# Patient Record
Sex: Female | Born: 1970 | Race: White | Hispanic: No | Marital: Married | Smoking: Never smoker
Health system: Southern US, Community
[De-identification: ages and names within clinical notes are randomized; demographics above are authoritative.]

## PROBLEM LIST (undated history)

## (undated) DIAGNOSIS — K529 Noninfective gastroenteritis and colitis, unspecified: Secondary | ICD-10-CM

## (undated) DIAGNOSIS — E039 Hypothyroidism, unspecified: Secondary | ICD-10-CM

## (undated) DIAGNOSIS — N329 Bladder disorder, unspecified: Secondary | ICD-10-CM

## (undated) DIAGNOSIS — F419 Anxiety disorder, unspecified: Secondary | ICD-10-CM

## (undated) DIAGNOSIS — D649 Anemia, unspecified: Secondary | ICD-10-CM

## (undated) HISTORY — DX: Anemia, unspecified: D64.9

## (undated) HISTORY — DX: Noninfective gastroenteritis and colitis, unspecified: K52.9

## (undated) HISTORY — DX: Anxiety disorder, unspecified: F41.9

## (undated) HISTORY — DX: Hypothyroidism, unspecified: E03.9

## (undated) HISTORY — PX: WISDOM TOOTH EXTRACTION: SHX21

## (undated) HISTORY — PX: MOUTH SURGERY: SHX715

## (undated) HISTORY — PX: DENTAL SURGERY: SHX609

---

## 1999-03-04 ENCOUNTER — Other Ambulatory Visit: Admission: RE | Admit: 1999-03-04 | Discharge: 1999-03-04 | Payer: Self-pay | Admitting: Family Medicine

## 2000-02-15 ENCOUNTER — Inpatient Hospital Stay (HOSPITAL_COMMUNITY): Admission: AD | Admit: 2000-02-15 | Discharge: 2000-02-15 | Payer: Self-pay | Admitting: *Deleted

## 2000-02-15 ENCOUNTER — Encounter: Payer: Self-pay | Admitting: *Deleted

## 2000-03-17 ENCOUNTER — Inpatient Hospital Stay (HOSPITAL_COMMUNITY): Admission: AD | Admit: 2000-03-17 | Discharge: 2000-03-17 | Payer: Self-pay | Admitting: *Deleted

## 2000-04-04 ENCOUNTER — Inpatient Hospital Stay (HOSPITAL_COMMUNITY): Admission: AD | Admit: 2000-04-04 | Discharge: 2000-04-04 | Payer: Self-pay | Admitting: Obstetrics & Gynecology

## 2000-04-04 ENCOUNTER — Encounter: Payer: Self-pay | Admitting: Obstetrics & Gynecology

## 2000-05-08 ENCOUNTER — Inpatient Hospital Stay (HOSPITAL_COMMUNITY): Admission: AD | Admit: 2000-05-08 | Discharge: 2000-05-11 | Payer: Self-pay | Admitting: Obstetrics and Gynecology

## 2001-07-25 ENCOUNTER — Other Ambulatory Visit: Admission: RE | Admit: 2001-07-25 | Discharge: 2001-07-25 | Payer: Self-pay | Admitting: Obstetrics & Gynecology

## 2002-03-03 ENCOUNTER — Other Ambulatory Visit: Admission: RE | Admit: 2002-03-03 | Discharge: 2002-03-03 | Payer: Self-pay | Admitting: Obstetrics & Gynecology

## 2002-08-22 ENCOUNTER — Encounter: Payer: Self-pay | Admitting: Obstetrics & Gynecology

## 2002-08-22 ENCOUNTER — Inpatient Hospital Stay (HOSPITAL_COMMUNITY): Admission: AD | Admit: 2002-08-22 | Discharge: 2002-08-22 | Payer: Self-pay | Admitting: Obstetrics & Gynecology

## 2002-10-01 ENCOUNTER — Inpatient Hospital Stay (HOSPITAL_COMMUNITY): Admission: AD | Admit: 2002-10-01 | Discharge: 2002-10-01 | Payer: Self-pay | Admitting: Obstetrics & Gynecology

## 2002-10-01 ENCOUNTER — Encounter: Payer: Self-pay | Admitting: Obstetrics & Gynecology

## 2002-10-03 ENCOUNTER — Inpatient Hospital Stay (HOSPITAL_COMMUNITY): Admission: AD | Admit: 2002-10-03 | Discharge: 2002-10-05 | Payer: Self-pay | Admitting: Obstetrics and Gynecology

## 2002-11-15 ENCOUNTER — Other Ambulatory Visit: Admission: RE | Admit: 2002-11-15 | Discharge: 2002-11-15 | Payer: Self-pay | Admitting: Obstetrics & Gynecology

## 2005-01-19 ENCOUNTER — Encounter: Admission: RE | Admit: 2005-01-19 | Discharge: 2005-01-19 | Payer: Self-pay | Admitting: Family Medicine

## 2006-10-12 ENCOUNTER — Ambulatory Visit: Payer: Self-pay | Admitting: Gastroenterology

## 2006-10-13 ENCOUNTER — Ambulatory Visit (HOSPITAL_COMMUNITY): Admission: RE | Admit: 2006-10-13 | Discharge: 2006-10-13 | Payer: Self-pay | Admitting: Gastroenterology

## 2007-07-07 DIAGNOSIS — R109 Unspecified abdominal pain: Secondary | ICD-10-CM | POA: Insufficient documentation

## 2008-08-28 ENCOUNTER — Emergency Department (HOSPITAL_BASED_OUTPATIENT_CLINIC_OR_DEPARTMENT_OTHER): Admission: EM | Admit: 2008-08-28 | Discharge: 2008-08-28 | Payer: Self-pay | Admitting: Emergency Medicine

## 2009-05-31 ENCOUNTER — Inpatient Hospital Stay (HOSPITAL_COMMUNITY): Admission: AD | Admit: 2009-05-31 | Discharge: 2009-05-31 | Payer: Self-pay | Admitting: Obstetrics & Gynecology

## 2009-06-24 ENCOUNTER — Inpatient Hospital Stay (HOSPITAL_COMMUNITY): Admission: AD | Admit: 2009-06-24 | Discharge: 2009-06-24 | Payer: Self-pay | Admitting: Obstetrics and Gynecology

## 2009-07-30 ENCOUNTER — Observation Stay (HOSPITAL_COMMUNITY): Admission: RE | Admit: 2009-07-30 | Discharge: 2009-07-30 | Payer: Self-pay | Admitting: Obstetrics & Gynecology

## 2009-08-02 ENCOUNTER — Inpatient Hospital Stay (HOSPITAL_COMMUNITY): Admission: AD | Admit: 2009-08-02 | Discharge: 2009-08-04 | Payer: Self-pay | Admitting: Obstetrics & Gynecology

## 2010-06-09 LAB — CBC
HCT: 27.8 % — ABNORMAL LOW (ref 36.0–46.0)
Hemoglobin: 9.4 g/dL — ABNORMAL LOW (ref 12.0–15.0)
RDW: 15.9 % — ABNORMAL HIGH (ref 11.5–15.5)

## 2010-06-10 LAB — CBC
HCT: 27.9 % — ABNORMAL LOW (ref 36.0–46.0)
Hemoglobin: 9.4 g/dL — ABNORMAL LOW (ref 12.0–15.0)
MCHC: 33.9 g/dL (ref 30.0–36.0)
RDW: 15.4 % (ref 11.5–15.5)

## 2010-06-11 LAB — CBC
HCT: 29.5 % — ABNORMAL LOW (ref 36.0–46.0)
Hemoglobin: 10 g/dL — ABNORMAL LOW (ref 12.0–15.0)
MCV: 88.8 fL (ref 78.0–100.0)
RBC: 3.32 MIL/uL — ABNORMAL LOW (ref 3.87–5.11)
WBC: 14.7 10*3/uL — ABNORMAL HIGH (ref 4.0–10.5)

## 2010-06-11 LAB — DIFFERENTIAL
Eosinophils Absolute: 0 10*3/uL (ref 0.0–0.7)
Eosinophils Relative: 0 % (ref 0–5)
Lymphs Abs: 1 10*3/uL (ref 0.7–4.0)
Monocytes Relative: 5 % (ref 3–12)

## 2010-06-11 LAB — URINALYSIS, ROUTINE W REFLEX MICROSCOPIC
Glucose, UA: NEGATIVE mg/dL
Ketones, ur: NEGATIVE mg/dL
Protein, ur: NEGATIVE mg/dL

## 2010-06-13 LAB — URINALYSIS, ROUTINE W REFLEX MICROSCOPIC
Glucose, UA: NEGATIVE mg/dL
Ketones, ur: NEGATIVE mg/dL
Protein, ur: NEGATIVE mg/dL

## 2010-06-13 LAB — URINE MICROSCOPIC-ADD ON

## 2010-06-30 LAB — RAPID STREP SCREEN (MED CTR MEBANE ONLY): Streptococcus, Group A Screen (Direct): NEGATIVE

## 2010-08-05 NOTE — Assessment & Plan Note (Signed)
HEALTHCARE                         GASTROENTEROLOGY OFFICE NOTE   ZURISADAI, HELMINIAK                         MRN:          [id number]  DATE:10/12/2006                            DOB:          1970/08/02    MAIN COMPLAINT:  Gina Bauer is a 40 year old white female referred for  evaluation of crampy intermittent lower abdominal pain.   HISTORY OF PRESENT ILLNESS:  Gina Bauer has had recurrent suprapubic  abdominal pain, which she describes as a dull aching sensation, which  may last a few hours to 24 hours in duration for at least 2 years.  She  had some of these episodes with her pregnancy with her last child, in  retrospect, but has had more often and frequent episodes of lower  abdominal dull aching pain over the last year occurring approximately  twice a month.  She has no precipitating events, except for stress.  There is no real alleviating element, and she has fairly regular bowel  movements without diarrhea, constipation, melena, or hematochezia.  Her  appetite is good and her weight is stable.  She denies upper GI or  hepatobiliary complaints.  She has not had any abdominal x-rays or  endoscopic exams.  She denies any specific food intolerances.  She  denies systemic complaints such as fever, chills, skin rashes, joint  pains, oral stomatitis, et Karie Soda.  She is due for followup gynecologic  exam in September, but denies any chronic recurrent gynecologic  problems.   PAST MEDICAL HISTORY:  Otherwise, noncontributory.   MEDICATIONS:  Vesicare 5 mg daily for urinary spasm.   She sees Dr. Isabel Caprice.   She denies drug allergies.   FAMILY HISTORY:  Remarkable for breast cancer in her mother, diabetes in  her mother, and prostate cancer in her father with several members with  atherosclerotic cardiovascular disease.  There is no history of known  gastrointestinal problems.   SOCIAL HISTORY:  She is married and lives with her spouse, kids,  and  parents.  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  She  does not smoke or abuse ethanol.   REVIEW OF SYSTEMS:  Entirely noncontributory, except for urinary  frequency.  Her last menstrual period was July 10 of this year.  She  denies current cardiovascular, pulmonary, neurological, orthopedic,  endocrine, dermatologic, or neuropsychiatric problems.   PHYSICAL EXAM:  She is an attractive and healthy-appearing female in no  distress, appearing her stated age.  She is 5 feet 1 inches tall and weighs 111 pounds.  Blood pressure is  108/56, and pulse was 74 and regular.  I could not appreciate stigmata of chronic liver disease or thyromegaly.  Her chest was entirely clear, but she did have a soft systolic ejection  murmur at the left lower sternal border with occasional ectopic beat.  I could not appreciate definite hepatosplenomegaly, abdominal masses, or  tenderness.  Bowel sounds were normal.  Peripheral extremities were unremarkable.  Rectal exam is refused by the patient.  Mental status is clear.   ASSESSMENT:  Gina Bauer has very unusual lower abdominal-suprapubic pain  without other gastrointestinal relationships.  I suspect she has a  variation of irritable bowel syndrome, but I do think she needs pelvic  ultrasound exam to exclude endometriosis or any other gynecologic  problems.  She is not scheduled with GYN until several months from now.   RECOMMENDATIONS:  1. Obtain recent laboratory data from Dr. Smith Mince.  2. Stool Hemoccult cards.  3. Pelvic ultrasound exam.  4. P.r.n. Bentyl every 6 to 8 hours as needed for abdominal cramps in      the interim.  5. GI followup in several weeks' time and consider further workup,      including colonoscopy depending on her workup and clinical course.     Vania Rea. Jarold Motto, MD, Caleen Essex, FAGA  Electronically Signed    DRP/MedQ  DD: 10/12/2006  DT: 10/12/2006  Job #: [id number]   cc:   Talmadge Coventry,  M.D.  Dr. Ruby Cola, M.D.

## 2010-08-08 NOTE — H&P (Signed)
NAME:  Gina Bauer, Gina Bauer                            ACCOUNT NO.:  [id number]   MEDICAL RECORD NO.:  [id number]                   PATIENT TYPE:  INP   LOCATION:  9160                                 FACILITY:  [facility]   PHYSICIAN:  Genia Del, M.D.             DATE OF BIRTH:  12-01-1970   DATE OF ADMISSION:  10/03/2002  DATE OF DISCHARGE:                                HISTORY & PHYSICAL   CHIEF COMPLAINT:  The patient is a 40 year old G-3, P-1, A-1, 0, 1.  Expected date of delivery October 11, 2002, at 38 weeks and six days gestation.   REASON FOR ADMISSION:  Induction for macrosomia and recurrent right  abdominal pain.   HISTORY OF PRESENT ILLNESS:  Fetal movements positive.  No regular uterine  contractions.  No PIH symptoms.  The patient was kept under observation at  [facility] on October 01, 2002, for recurrent right abdominal pain.  At that time an  abdominal ultrasound was negative.  A renal ultrasound showed moderate right  hydronephrosis, but no stone.  Urinalysis was within normal limits.  We did  an obstetrical ultrasound which showed an estimated fetal weight above the  95th percentile at 9 pounds and 3 ounces, to 9 pounds and 10 ounces.  Amniotic fluid was normal.  The presentation was cephalic.   PAST OBSTETRICAL HISTORY:  1. G-1 in February 2002, 41 weeks, a spontaneous vaginal delivery.  Baby boy     weighing 8 pounds and 12 ounces.  No complications.  2. In April 2003, five to six-week spontaneous abortion complete.  No     complications.  3. In G-1 the patient had mild thrombocytopenia of pregnancy, and anemia.   PAST SURGICAL HISTORY:  Gum surgery in 1999, and in 2000.   PAST MEDICAL HISTORY:  Negative.   ALLERGIES:  No known drug allergies.   MEDICATIONS:  Prenatal vitamins and iron supplements.   SOCIAL HISTORY:  Married, a nonsmoker.   FAMILY HISTORY:  Noncontributory.   HISTORY OF PRESENT PREGNANCY:  First trimester was normal.  Labs showed a  hemoglobin at  11.8, platelets 204.  Blood group A-positive.  Rh antibodies  negative.  RPR nonreactive.  HBsAG negative.  Rubella titer immune.  HIV  declined.  Gonorrhea and Chlamydia negative.  Pap test within normal limits.  An ultrasound done at 8+ weeks revealed a single intrauterine pregnancy.  Fetal heart rate 176.  Dating corresponded.  In the second trimester the patient declined the triple test.  An ultrasound  review of her anatomy was done at 21+ weeks.  It was within normal limits.  The placenta anterior and normal.  Amniotic fluid normal.  Pelvic closed at  4.3 cm.  Dating corresponded.  In the third trimester a one-hour glucose tolerance test was within normal  limits.  Group-B strep was negative at 35+ weeks.  Blood pressures remained  normal throughout the pregnancy.  Uterine height corresponded, although the  last ultrasound showed macrosomia.   REVIEW OF SYSTEMS:  CONSTITUTIONAL:  Negative.  HEENT:  Negative.  CARDIOVASCULAR:  Negative.  RESPIRATORY:  Negative.  UROLOGIC/GI:  Negative,  but recurrent right abdominal pain with a negative workup.  GENITOURINARY/ENDOCRINE/NEUROLOGIC:  Negative.   PHYSICAL EXAMINATION:  GENERAL:  In no apparent distress.  VITAL SIGNS:  Blood pressure 114/66, pulse 82 and regular, respirations 20,  temperature 97.2 degrees.  LUNGS:  Clear bilaterally.  HEART:  A regular cardiac rhythm.  ABDOMEN:  Gravid uterus, cephalic presentation.  PELVIC:  The vaginal examination on admission 3 cm dilated, 70% effaced,  vertex -1.  Membranes intact.  EXTREMITIES:  Lower limbs normal.  MONITORING:  Rare uterine contractions.  Fetal heart rate 140 with  accelerations, no decelerations.  Reactive NST.   IMPRESSION:  1. G-3, P-1, A-1 at 38 weeks and six days gestation with a high suspicion of     macrosomia and recurrent right abdominal pain.  Fetal well being     reassuring.  Favorable cervix.  2. Group beta Streptococcus negative.   PLAN:  Admit to labor and  delivery.  Induction with low-dose Pitocin and an  artificial rupture of membranes.  Monitoring.  Expectant management towards  probably vaginal delivery.                                                Genia Del, M.D.    ML/MEDQ  D:  10/03/2002  T:  10/03/2002  Job:  [id number]

## 2010-08-08 NOTE — Consult Note (Signed)
[facility]  Patient:    Gina Bauer, Gina Bauer                         MRN: [id number] Adm. Date:  [id number] Attending:  Ermalene Searing CC:         Marina Gravel, M.D.   Consultation Report  GENERAL SURGICAL CONSULTATION  REASON FOR CONSULTATION:  Right lower quadrant abdominal pain, rule out appendicitis.  HISTORY OF PRESENT ILLNESS:  This is a 40 year old white female who is [redacted] weeks pregnant with her first pregnancy.  She had been doing well.  Yesterday morning she developed a sense of malaise and anorexia.  She spent most of yesterday driving back from [place] with her husband.  Yesterday she developed a watery diarrhea and in the evening hours she had at least three episodes of diarrhea.  She has not had a bowel movement since 8 p.m. last night.  She developed crampy, lower abdominal pain yesterday afternoon and about midnight last night this pain became more steady in character.  She vomited once after coming to the [facility], but has not vomited since.  She denies back pain, denies urinary frequency or dysuria.  She denies fever, chills or prior episodes.  She was initially evaluated by Dr. Marina Gravel who felt she was having a lot of pain, and specifically thought she had localized tenderness in the right lower quadrant.  The patient now states she is much better and is beginning to be hungry.  PAST HISTORY:  No gynecologic problems.  She had a workup for hematuria in the past which was negative.  CURRENT MEDICATIONS:  Prenatal vitamins.  ALLERGIES:  No known drug allergies.  FAMILY HISTORY:  History of stroke, asthma, DVT and rheumatic fever in the family.  REVIEW OF SYSTEMS:  All systems are reviewed and are noncontributory.  PHYSICAL EXAMINATION:  GENERAL:  A pleasant young woman who appears to be in minimal if any distress. She is alert and cooperative.  VITAL SIGNS:  Temperature is 98.9, heart rate  111, fetal heart tones in the 150s and apparently normal.  Respiratory rate 18, blood pressure 128/79.  HEENT:  Sclerae clear, extraocular movements intact.  NECK:  No adenopathy or mass.  LUNGS:  Clear to auscultation, no wheezing.  HEART:  Regular tachycardia, no murmur.  ABDOMEN:  Gravid.  Soft.  Hypoactive bowel sounds.  There is minimal diffuse tenderness, no localized tenderness, no guarding, rebound or hernia.  LABORATORY DATA:  White blood cell count 16,500 last night.  Repeat this morning 12,600 but there is a left shift.  Basic metabolic panel shows a potassium of 3.3 and is otherwise unremarkable.  CT scan shows right hydronephrosis which is thought to be physiologic for pregnancy.  There is no evidence for appendicitis or inflammatory change in the small bowel or large bowel.  There is no fluid collection or abscess, no bowel wall thickening.  IMPRESSION:  Abdominal pain. Most likely viral gastroenteritis.  This may be related to her right hydronephrosis, in some way, but I doubt this.  I strongly doubt she has appendicitis.  PLAN:  The patients care was discussed with Dr. Marina Gravel, the patient and her husband.  We are going to allow her to eat while she is here and if she tolerates the diet we will let her go home with close observational follow-up by her obstetrician.  She may follow-up with me p.r.n. DD:  02/15/00 TD:  02/15/00 Job: [id number]

## 2010-08-08 NOTE — H&P (Signed)
[facility]  Patient:    Gina Bauer, Gina Bauer                      MRN: [id number] Adm. Date:  05/08/00 Attending:  Genia Del, M.D.                         History and Physical  DATE OF BIRTH:                Jul 04, 1970  REASON FOR ADMISSION:         Induction for postdates.  HISTORY OF PRESENT ILLNESS:   Gina Bauer is a 40 year old woman, G1, expected date of delivery per ultrasound of May 01, 2000, at [redacted] weeks gestation. Fetal movements positive.  Rare uterine contractions.  No vaginal bleeding. No fluid leak.  No PIH symptoms.  She was last seen at the office on May 05, 2000.  NST was done at that time which was reactive.  She had an ultrasound on May 03, 2000, showing an estimated fetal weight of 3789 g for the 60% percentile.  Amniotic fluid index was slightly decreased at 7.6 cm for the 9th percentile.  Placenta was grade 3.  Biophysical profile was 8/8.  Presentation was solid.  PAST MEDICAL HISTORY:         1. Hematuria, with a negative workup.                               2. History of mild anemia.  PAST SURGICAL HISTORY:        Negative.  FAMILY HISTORY:               Positive for asthma, insulin-dependent diabetes mellitus, cardiac disease, and breast cancer in mother.  Prostate cancer in father.  ALLERGIES:                    No known drug allergies.  SOCIAL HISTORY:               Married.  Nonsmoker.  MEDICATIONS:                  Prenatal vitamins.  HISTORY OF PRESENT PREGNANCY:                    First trimester was unremarkable.  Laboratories in the first trimester showed hemoglobin 12.5, platelets were low at 97,000. Blood group A positive, Rh negative, RPR nonreactive.   HBsAg negative.  HIV negative.  Rubella immune.  Given the low platelets, they were repeated at 14+ weeks, and they were the same at 97,000.  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  The platelets then improved  and came back to normal levels.  The last platelet count was 251,000.  Triple test at 16+ weeks was within normal limits.  Ultrasound at 20+ weeks showed normal review of anatomy, normal anterior placenta.  Cervix was at 4.1 cm.  Fluid was normal. There was a discrepancy in dating of 10 days, and expected date of delivery was changed by ultrasound to May 01, 2000.  A repeat ultrasound was done then at 24+ weeks and showed a good interval growth corresponding to the 87th percentile given the ultrasound dating.  A one-hour GTT was within normal limits at 28 weeks, and the group B strep was negative at 35+ weeks.  Blood pressures remained normal throughout pregnancy.  REVIEW OF SYSTEMS:            CONSTITUTIONAL:  Negative.  RESPIRATORY: Negative.  CARDIOVASCULAR:  Negative.  GASTROINTESTINAL:  Negative.  UROLOGIC: Negative.  DERMATOLOGIC:  Negative.  NEUROLOGIC:  Negative.  PHYSICAL EXAMINATION:  GENERAL:                      No apparent distress.  VITAL SIGNS:                  Blood pressure 110/74, pulse regular, respiratory rate 20, temperature normal.  LUNGS:                        Clear bilaterally.  HEART:                        S1, S2 normal.  No murmur.  ABDOMEN:                      Soft.  Uterine height at 39 cm.  Cephalic presentation.  PELVIC:                       Vaginal exam was closed, soft, 1.5 cm long, vertex -2 to -1, otherwise normal, minimal edema.  Monitoring fetal heart rate 130-140 per minute, good variability, no deceleration per office NST.  IMPRESSION:                   A G1 at [redacted] weeks gestation with borderline oligohydramnios.  Fetal well-being reassuring.  Group B strep negative. History of thrombocytopenia which resolved during the pregnancy.  PLAN:                         Admit to labor and delivery.  Induction with Cervidil given the nonfavorable cervix, and then probable Pitocin and artificial rupture of membranes.  Monitoring.  Expectant  management towards probable vaginal delivery. DD:  05/08/00 TD:  05/08/00 Job: [id number]

## 2010-08-08 NOTE — Consult Note (Signed)
Gina Bauer, Gina Bauer                            ACCOUNT NO.:  [id number]   MEDICAL RECORD NO.:  [id number]                   PATIENT TYPE:  MAT   LOCATION:  MATC                                 FACILITY:  [facility]   PHYSICIAN:  Lenoard Aden, M.D.             DATE OF BIRTH:  [birth year]   DATE OF CONSULTATION:  08/22/2002  DATE OF DISCHARGE:                                   CONSULTATION   CHIEF COMPLAINT:  Lower abdominal pain.   HISTORY OF PRESENT ILLNESS:  A 40 year old white female, G3, P1, EDD October 11, 2002, at 33+ weeks, who presents with lower abdominal cramping and  discomfort.   ALLERGIES:  Patient has no known drug allergies.   MEDICATIONS:  Prenatal vitamins.   OBSTETRICAL HISTORY:  1. An 8-pound-12-ounce vaginal delivery, 2002.  2. SAB in 2003.   HOSPITALIZATIONS:  No other medical or surgical hospitalizations.   PAST MEDICAL HISTORY:  1. History of thrombocytopenia with previous pregnancy.  2. History of hematuria in the past with negative workup.   FAMILY HISTORY:  Patient has a family history of heart surgery, anemia,  asthma, and insulin-dependent diabetes.   PRENATAL COURSE:  Complicated by intermittent lower abdominal pain with  negative workup.  Blood type A positive, Rubella immune, hepatitis negative,  HIV declined.   PHYSICAL EXAMINATION:  GENERAL:  She is an uncomfortable-appearing white  female in no acute distress.  HEENT:  Normal.  LUNGS:  Clear.  HEART:  Regular rate and rhythm.  ABDOMEN:  Soft, gravid, and no appropriate fundal tenderness.  No right  upper quadrant tenderness.  PELVIC:  Cervix is flared, but closed, 3 cm long; presenting part is breech.   LABORATORY DATA:  Ultrasound performed at [facility] reveals an AGA  fetus with an AFI of 14.9, frank breech presenting, and a normal anterior  placenta, transabdominal cervical length of 3.9 cm.  External fetal heart  monitor reveals a reactive fetal heart rate tracing and  contractions every 2  to 4 minutes.   IMPRESSION:  1. A 33-week obstetric.  2. Premature contractions, no evidence of cervical change.  3. Hematuria with esterase-positive, rule out urinary tract infection.    PLAN:  1. Keflex 500 mg p.o. q.i.d. times seven days.  2. Fluid hydration.  3. Discussed terbutaline 0.25 mg subcu times one, repeat in 20 minutes if no     improvement.   FOLLOW UP:  Follow up in the office within one week if discharged;  otherwise, if refractory preterm labor noted, we will proceed with inpatient  admission.                                               Lenoard Aden, M.D.    RJT/MEDQ  D:  08/22/2002  T:  08/22/2002  Job:  [id number]

## 2014-02-08 DIAGNOSIS — N3941 Urge incontinence: Secondary | ICD-10-CM | POA: Insufficient documentation

## 2014-02-08 DIAGNOSIS — M26629 Arthralgia of temporomandibular joint, unspecified side: Secondary | ICD-10-CM | POA: Insufficient documentation

## 2015-03-07 DIAGNOSIS — E559 Vitamin D deficiency, unspecified: Secondary | ICD-10-CM | POA: Insufficient documentation

## 2015-03-07 DIAGNOSIS — D239 Other benign neoplasm of skin, unspecified: Secondary | ICD-10-CM | POA: Insufficient documentation

## 2015-05-05 ENCOUNTER — Emergency Department (HOSPITAL_BASED_OUTPATIENT_CLINIC_OR_DEPARTMENT_OTHER)
Admission: EM | Admit: 2015-05-05 | Discharge: 2015-05-06 | Disposition: A | Discharge status: Home / Self Care | Payer: BLUE CROSS/BLUE SHIELD | Attending: Emergency Medicine | Admitting: Emergency Medicine

## 2015-05-05 ENCOUNTER — Emergency Department (HOSPITAL_BASED_OUTPATIENT_CLINIC_OR_DEPARTMENT_OTHER): Payer: BLUE CROSS/BLUE SHIELD

## 2015-05-05 ENCOUNTER — Encounter (HOSPITAL_BASED_OUTPATIENT_CLINIC_OR_DEPARTMENT_OTHER): Payer: Self-pay | Admitting: Emergency Medicine

## 2015-05-05 DIAGNOSIS — W010XXA Fall on same level from slipping, tripping and stumbling without subsequent striking against object, initial encounter: Secondary | ICD-10-CM | POA: Diagnosis not present

## 2015-05-05 DIAGNOSIS — Z87448 Personal history of other diseases of urinary system: Secondary | ICD-10-CM | POA: Insufficient documentation

## 2015-05-05 DIAGNOSIS — Y9389 Activity, other specified: Secondary | ICD-10-CM | POA: Diagnosis not present

## 2015-05-05 DIAGNOSIS — S0121XA Laceration without foreign body of nose, initial encounter: Secondary | ICD-10-CM | POA: Insufficient documentation

## 2015-05-05 DIAGNOSIS — Z79899 Other long term (current) drug therapy: Secondary | ICD-10-CM | POA: Insufficient documentation

## 2015-05-05 DIAGNOSIS — S0992XA Unspecified injury of nose, initial encounter: Secondary | ICD-10-CM | POA: Diagnosis present

## 2015-05-05 DIAGNOSIS — Y998 Other external cause status: Secondary | ICD-10-CM | POA: Diagnosis not present

## 2015-05-05 DIAGNOSIS — Y92009 Unspecified place in unspecified non-institutional (private) residence as the place of occurrence of the external cause: Secondary | ICD-10-CM | POA: Diagnosis not present

## 2015-05-05 HISTORY — DX: Bladder disorder, unspecified: N32.9

## 2015-05-05 MED ORDER — HYDROCODONE-ACETAMINOPHEN 5-325 MG PO TABS
1.0000 | ORAL_TABLET | Freq: Once | ORAL | Status: AC
  Administered 2015-05-06: 1 via ORAL
  Filled 2015-05-05: qty 1

## 2015-05-05 NOTE — ED Notes (Signed)
Patient reports that she was at home and tripped over the dog. Patient has laceration to the bridge of her nose.

## 2015-05-06 NOTE — ED Provider Notes (Signed)
CSN: [id number]     Arrival date & time 05/05/15  2003 History   First MD Initiated Contact with Patient 05/05/15 2312     Chief Complaint  Patient presents with  . Fall   HPI  Gina Bauer is a 45 year old female presenting after a fall. She states that she tripped over the dog and fell forward onto her face. She struck her face on the floor of her home. She is complaining of pain and swelling to her nose. She also endorses a small abrasion at the bridge of her nose. She denies any other facial pain. She states she still able to breathe through both of her nostrils. She had a nosebleed immediately after the accident but this resolved with steady pressure. She did not lose consciousness when she struck the floor. Denies neck pain, visual disturbances, dental injury or bruising to the face. She has no other complaints today.  Past Medical History  Diagnosis Date  . Bladder disorder    History reviewed. No pertinent past surgical history. History reviewed. No pertinent family history. Social History  Substance Use Topics  . Smoking status: Never Smoker   . Smokeless tobacco: None  . Alcohol Use: None   OB History    No data available     Review of Systems  HENT: Positive for facial swelling and nosebleeds.   All other systems reviewed and are negative.     Allergies  Review of patient's allergies indicates no known allergies.  Home Medications   Prior to Admission medications   Medication Sig Start Date End Date Taking? Authorizing Provider  ALPRAZolam Duanne Moron) 0.25 MG tablet Take 0.25 mg by mouth at bedtime as needed for anxiety.   Yes Historical Provider, MD  solifenacin (VESICARE) 10 MG tablet Take 10 mg by mouth daily.   Yes Historical Provider, MD   BP 113/67 mmHg  Pulse 87  Temp(Src) 97.9 F (36.6 C) (Oral)  Resp 18  Ht 5\' 1"  (1.549 m)  Wt 55.339 kg  BMI 23.06 kg/m2  SpO2 100%  LMP 04/28/2015 Physical Exam  Constitutional: She appears well-developed and  well-nourished. No distress.  HENT:  Head: Normocephalic and atraumatic. Head is without raccoon's eyes and without Battle's sign.    Nose: Nose lacerations present. No nasal deformity, septal deviation or nasal septal hematoma. No epistaxis.    Swelling of nose with generalized tenderness over nasal bridge. 1 cm laceration at bridge of her nose slightly to the left. Wound is superficial and bleeding resolved without intervention. Nares are patent bilaterally. No active epistaxis. No septal deviation, perforation or hematoma.   Eyes: Conjunctivae are normal. Right eye exhibits no discharge. Left eye exhibits no discharge. No scleral icterus.  Neck: Normal range of motion.  No tenderness over cervical spine. FROM intact without pain  Cardiovascular: Normal rate and regular rhythm.   Pulmonary/Chest: Effort normal. No respiratory distress.  Musculoskeletal: Normal range of motion.  Neurological: She is alert. Coordination normal.  Skin: Skin is warm and dry.  Psychiatric: She has a normal mood and affect. Her behavior is normal.  Nursing note and vitals reviewed.   ED Course  Procedures (including critical care time) Labs Review Labs Reviewed - No data to display  Imaging Review Dg Nasal Bones  05/06/2015  CLINICAL DATA:  Tripped over dog with laceration to the bridge of the nose. EXAM: NASAL BONES - 3+ VIEW COMPARISON:  CT head 01/20/2015 FINDINGS: There is no evidence of fracture or other bone abnormality. IMPRESSION: Negative.  Electronically Signed   By: Lucienne Capers M.D.   On: 05/06/2015 00:07   I have personally reviewed and evaluated these images and lab results as part of my medical decision-making.   EKG Interpretation None      MDM   Final diagnoses:  Nose injury, initial encounter   45 year old female presenting after a fall with nasal injury. No LOC. Nose is swollen and tender without obvious deformity. 1 cm superficial laceration noted to the bridge of the nose  with bleeding controlled without intervention. Wound amenable to repair with dermabond. DG nasal bones negative for fracture. Discussed adhesive wound care. Given referral information for ENT if pt needs. Return precautions given in discharge paperwork and discussed with pt at bedside. Pt stable for discharge   Josephina Gip, PA-C 05/06/15 0038  Shanon Rosser, MD 05/06/15 [phone]

## 2015-05-06 NOTE — Discharge Instructions (Signed)
Do not scrub the dermabond. Pat dry after showering or washing your face. The dermabond will fall off in 5-10 days as your skin heals. Follow up with Dr. Janace Hoard (ENT) if you have continued problems with your nose.   Tissue Adhesive Wound Care    Some cuts, wounds, lacerations, and incisions can be repaired by using tissue adhesive. Tissue adhesive is like glue. It holds the skin together, allowing for faster healing. It forms a strong bond on the skin in about 1 minute and reaches its full strength in about 2 or 3 minutes. The adhesive disappears naturally while the wound is healing. It is important to take proper care of your wound at home while it heals.  HOME CARE INSTRUCTIONS  Showers are allowed. Do not soak the area containing the tissue adhesive. Do not take baths, swim, or use hot tubs. Do not use any soaps or ointments on the wound. Certain ointments can weaken the glue.  If a bandage (dressing) has been applied, follow your health care provider's instructions for how often to change the dressing.  Keep the dressing dry if one has been applied.  Do not scratch, pick, or rub the adhesive.  Do not place tape over the adhesive. The adhesive could come off when pulling the tape off.  Protect the wound from further injury until it is healed.  Protect the wound from sun and tanning bed exposure while it is healing and for several weeks after healing.  Only take over-the-counter or prescription medicines as directed by your health care provider.  Keep all follow-up appointments as directed by your health care provider. SEEK IMMEDIATE MEDICAL CARE IF:  Your wound becomes red, swollen, hot, or tender.  You develop a rash after the glue is applied.  You have increasing pain in the wound.  You have a red streak that goes away from the wound.  You have pus coming from the wound.  You have increased bleeding.  You have a fever.  You have shaking chills.  You notice a bad smell coming from the  wound.  Your wound or adhesive breaks open.  MAKE SURE YOU:  Understand these instructions.  Will watch your condition.  Will get help right away if you are not doing well or get worse. This information is not intended to replace advice given to you by your health care provider. Make sure you discuss any questions you have with your health care provider.  Document Released: 09/02/2000 Document Revised: 12/28/2012 Document Reviewed: 09/28/2012  Elsevier Interactive Patient Education [employer].

## 2016-03-04 DIAGNOSIS — Z0000 Encounter for general adult medical examination without abnormal findings: Secondary | ICD-10-CM | POA: Insufficient documentation

## 2016-03-04 DIAGNOSIS — Z23 Encounter for immunization: Secondary | ICD-10-CM | POA: Insufficient documentation

## 2016-07-12 ENCOUNTER — Emergency Department (HOSPITAL_BASED_OUTPATIENT_CLINIC_OR_DEPARTMENT_OTHER): Payer: BLUE CROSS/BLUE SHIELD

## 2016-07-12 ENCOUNTER — Encounter (HOSPITAL_BASED_OUTPATIENT_CLINIC_OR_DEPARTMENT_OTHER): Payer: Self-pay | Admitting: Emergency Medicine

## 2016-07-12 ENCOUNTER — Emergency Department (HOSPITAL_BASED_OUTPATIENT_CLINIC_OR_DEPARTMENT_OTHER)
Admission: EM | Admit: 2016-07-12 | Discharge: 2016-07-12 | Disposition: A | Discharge status: Home / Self Care | Payer: BLUE CROSS/BLUE SHIELD | Attending: Emergency Medicine | Admitting: Emergency Medicine

## 2016-07-12 DIAGNOSIS — R002 Palpitations: Secondary | ICD-10-CM | POA: Diagnosis not present

## 2016-07-12 DIAGNOSIS — R0789 Other chest pain: Secondary | ICD-10-CM | POA: Diagnosis present

## 2016-07-12 LAB — CBC WITH DIFFERENTIAL/PLATELET
BASOS ABS: 0 10*3/uL (ref 0.0–0.1)
BASOS PCT: 0 %
EOS ABS: 0.2 10*3/uL (ref 0.0–0.7)
Eosinophils Relative: 2 %
HCT: 29.8 % — ABNORMAL LOW (ref 36.0–46.0)
HEMOGLOBIN: 9.7 g/dL — AB (ref 12.0–15.0)
Lymphocytes Relative: 32 %
Lymphs Abs: 2.2 10*3/uL (ref 0.7–4.0)
MCH: 27.1 pg (ref 26.0–34.0)
MCHC: 32.6 g/dL (ref 30.0–36.0)
MCV: 83.2 fL (ref 78.0–100.0)
Monocytes Absolute: 0.8 10*3/uL (ref 0.1–1.0)
Monocytes Relative: 11 %
NEUTROS PCT: 55 %
Neutro Abs: 3.7 10*3/uL (ref 1.7–7.7)
Platelets: 249 10*3/uL (ref 150–400)
RBC: 3.58 MIL/uL — AB (ref 3.87–5.11)
RDW: 15.9 % — ABNORMAL HIGH (ref 11.5–15.5)
WBC: 6.8 10*3/uL (ref 4.0–10.5)

## 2016-07-12 LAB — BASIC METABOLIC PANEL
Anion gap: 8 (ref 5–15)
BUN: 17 mg/dL (ref 6–20)
CHLORIDE: 106 mmol/L (ref 101–111)
CO2: 26 mmol/L (ref 22–32)
Calcium: 8.7 mg/dL — ABNORMAL LOW (ref 8.9–10.3)
Creatinine, Ser: 0.55 mg/dL (ref 0.44–1.00)
GFR calc Af Amer: >60 mL/min (ref >60)
GFR calc non Af Amer: >60 mL/min (ref >60)
Glucose, Bld: 128 mg/dL — ABNORMAL HIGH (ref 65–99)
POTASSIUM: 3.3 mmol/L — AB (ref 3.5–5.1)
SODIUM: 140 mmol/L (ref 135–145)

## 2016-07-12 LAB — D-DIMER, QUANTITATIVE (NOT AT ARMC): D DIMER QUANT: 0.29 ug{FEU}/mL (ref 0.00–0.50)

## 2016-07-12 LAB — TROPONIN I

## 2016-07-12 MED ORDER — RANITIDINE HCL 150 MG PO CAPS: 150.0000 mg | ORAL_CAPSULE | Freq: Every day | ORAL | 0 refills | Status: DC

## 2016-07-12 MED ORDER — OMEPRAZOLE 20 MG PO CPDR: 20.0000 mg | DELAYED_RELEASE_CAPSULE | Freq: Two times a day (BID) | ORAL | 0 refills | Status: DC

## 2016-07-12 MED ORDER — GI COCKTAIL ~~LOC~~
30.0000 mL | Freq: Once | ORAL | Status: AC
  Administered 2016-07-12: 30 mL via ORAL
  Filled 2016-07-12: qty 30

## 2016-07-12 MED ORDER — ACETAMINOPHEN 325 MG PO TABS
650.0000 mg | ORAL_TABLET | Freq: Once | ORAL | Status: AC
  Administered 2016-07-12: 650 mg via ORAL
  Filled 2016-07-12: qty 2

## 2016-07-12 NOTE — Discharge Instructions (Signed)
Please take prilosec and zantac 30 minutes before each major meal as it may help with your condition.  Follow up with your primary care provider for further care.  Return if you have any concerns.

## 2016-07-12 NOTE — ED Triage Notes (Signed)
Patient states that she has had some "chest tightness" since early this am. She became concerned tonight when it worsened when she layed down to go to bed. The patient denies any SOB / difficulty breathing or any other S/S

## 2016-07-12 NOTE — ED Notes (Signed)
Patient transported to X-ray 

## 2016-07-12 NOTE — ED Notes (Signed)
Pt discharged to home with family. NAD.  

## 2016-07-12 NOTE — ED Provider Notes (Signed)
[name] Provider Note   CSN: [id number] Arrival date & time: 07/12/16  2103  By signing my name below, I, Dora Sims, attest that this documentation has been prepared under the direction and in the presence of Domenic Moras, PA-C . Electronically Signed: Dora Sims, Scribe. 07/12/2016. 9:29 PM.  History   Chief Complaint Chief Complaint  Patient presents with  . Chest Pain    The history is provided by the patient. No language interpreter was used.     HPI Comments: Gina Bauer is a 46 y.o. female who presents to the Emergency Department complaining of persistent sternal chest pain throughout the day today. She describes her chest discomfort as a sensation of "tightness" and "heaviness". Pt reports some associated occasional palpitations. She states she had a brief episode of the same pain two days ago that occurred shortly after eating fried fish for lunch. No h/o the same. No medications or treatments tried. No recent changes in daily activities or over-exertion. No recent surgeries or immobilizations. No h/o heart disease. She does not smoke or drink alcohol. She uses oral hormone therapy. Patient denies nausea, vomiting, dizziness, lightheadedness, diaphoresis, SOB, an unusual taste in her mouth, leg swelling, calf pain, rashes, or any other associated symptoms  Past Medical History:  Diagnosis Date  . Bladder disorder     Patient Active Problem List   Diagnosis Date Noted  . PELVIC  PAIN 07/07/2007    History reviewed. No pertinent surgical history.  OB History    No data available       Home Medications    Prior to Admission medications   Medication Sig Start Date End Date Taking? Authorizing Provider  ALPRAZolam Duanne Moron) 0.25 MG tablet Take 0.25 mg by mouth at bedtime as needed for anxiety.    Historical Provider, MD  solifenacin (VESICARE) 10 MG tablet Take 10 mg by mouth daily.    Historical Provider, MD    Family History History  reviewed. No pertinent family history.  Social History Social History  Substance Use Topics  . Smoking status: Never Smoker  . Smokeless tobacco: Never Used  . Alcohol use Not on file     Allergies   Patient has no known allergies.   Review of Systems Review of Systems  Constitutional: Negative for diaphoresis.  Respiratory: Positive for chest tightness. Negative for shortness of breath.   Cardiovascular: Positive for chest pain and palpitations. Negative for leg swelling.  Gastrointestinal: Negative for nausea and vomiting.  Musculoskeletal: Negative for myalgias.  Skin: Negative for rash.  Neurological: Negative for dizziness and light-headedness.   Physical Exam Updated Vital Signs BP 112/69 (BP Location: Left Arm)   Pulse 90   Temp 98.2 F (36.8 C) (Oral)   Resp 20   Ht 5\' 1"  (1.549 m)   Wt 160 lb (72.6 kg)   LMP 07/05/2016   SpO2 100%   BMI 30.23 kg/m   Physical Exam  Constitutional: She is oriented to person, place, and time. She appears well-developed and well-nourished. No distress.  HENT:  Head: Normocephalic and atraumatic.  Eyes: Conjunctivae and EOM are normal.  Neck: Neck supple. No tracheal deviation present.  Cardiovascular: Normal rate, regular rhythm and normal heart sounds.  Exam reveals no gallop and no friction rub.   No murmur heard. Pulmonary/Chest: Effort normal and breath sounds normal. No respiratory distress. She has no wheezes. She has no rales. She exhibits no tenderness.  Musculoskeletal: Normal range of motion.  Neurological: She is alert  and oriented to person, place, and time.  Skin: Skin is warm and dry.  Psychiatric: She has a normal mood and affect. Her behavior is normal.  Nursing note and vitals reviewed.  ED Treatments / Results  Labs (all labs ordered are listed, but only abnormal results are displayed) Labs Reviewed  BASIC METABOLIC PANEL - Abnormal; Notable for the following:       Result Value   Potassium 3.3 (*)      Glucose, Bld 128 (*)    Calcium 8.7 (*)    All other components within normal limits  CBC WITH DIFFERENTIAL/PLATELET - Abnormal; Notable for the following:    RBC 3.58 (*)    Hemoglobin 9.7 (*)    HCT 29.8 (*)    RDW 15.9 (*)    All other components within normal limits  TROPONIN I  D-DIMER, QUANTITATIVE (NOT AT [facility])    EKG  EKG Interpretation  Date/Time:  Sunday July 12 2016 21:12:22 EDT Ventricular Rate:  81 PR Interval:  130 QRS Duration: 92 QT Interval:  374 QTC Calculation: 434 R Axis:   81 Text Interpretation:  Normal sinus rhythm Normal ECG No STEMI.  Confirmed by LONG MD, JOSHUA [phone]) on 07/12/2016 9:38:26 PM       Radiology Dg Chest 2 View  Result Date: 07/12/2016 CLINICAL DATA:  46 year old female with chest pain. EXAM: CHEST  2 VIEW COMPARISON:  None. FINDINGS: The heart size and mediastinal contours are within normal limits. Both lungs are clear. The visualized skeletal structures are unremarkable. IMPRESSION: No active cardiopulmonary disease. Electronically Signed   By: Anner Crete M.D.   On: 07/12/2016 22:00    Procedures Procedures (including critical care time)  DIAGNOSTIC STUDIES: Oxygen Saturation is 100% on RA, normal by my interpretation.    COORDINATION OF CARE: 9:36 PM Discussed treatment plan with pt at bedside and pt agreed to plan.  Medications Ordered in ED Medications - No data to display   Initial Impression / Assessment and Plan / ED Course  I have reviewed the triage vital signs and the nursing notes.  Pertinent labs & imaging results that were available during my care of the patient were reviewed by me and considered in my medical decision making (see chart for details).     BP 98/81   Pulse 82   Temp 98.2 F (36.8 C) (Oral)   Resp (!) 21   Ht 5\' 1"  (1.549 m)   Wt 72.6 kg   LMP 07/05/2016   SpO2 100%   BMI 30.23 kg/m    Final Clinical Impressions(s) / ED Diagnoses   Final diagnoses:  Atypical chest pain     New Prescriptions New Prescriptions   OMEPRAZOLE (PRILOSEC) 20 MG CAPSULE    Take 1 capsule (20 mg total) by mouth 2 (two) times daily before a meal.   RANITIDINE (ZANTAC) 150 MG CAPSULE    Take 1 capsule (150 mg total) by mouth daily.   I personally performed the services described in this documentation, which was scribed in my presence. The recorded information has been reviewed and is accurate.   Pt presents with chest pain. An EKG was immediately obtained which shows no ST segment elevation. The patient was placed on a cardiopulmonary monitor, oxygen and an IV established. We will obtain comprehensive laboratory studies including cardiac enzymes in addition to a chest x-ray. We will treat the patients pain, and give GI cocktail.  11:19 PM Pain atypical for ACS.  Heart score of 2.  D-dimer neg, doubt PE.  Pt report relief with GI cocktail, suspect gastritis.  PPI and H2 blocker prescribe.  outpt f/u recommended.  Return precaution given.     Domenic Moras, PA-C 07/12/16 [place], MD 07/13/16 [phone]

## 2016-07-30 ENCOUNTER — Encounter: Payer: Self-pay | Admitting: Obstetrics & Gynecology

## 2016-08-06 ENCOUNTER — Ambulatory Visit (INDEPENDENT_AMBULATORY_CARE_PROVIDER_SITE_OTHER): Payer: BLUE CROSS/BLUE SHIELD | Admitting: Obstetrics & Gynecology

## 2016-08-06 ENCOUNTER — Encounter: Payer: Self-pay | Admitting: Obstetrics & Gynecology

## 2016-08-06 VITALS — BP 112/70 | Ht 62.0 in | Wt 120.0 lb

## 2016-08-06 DIAGNOSIS — Z01419 Encounter for gynecological examination (general) (routine) without abnormal findings: Secondary | ICD-10-CM

## 2016-08-06 DIAGNOSIS — G43829 Menstrual migraine, not intractable, without status migrainosus: Secondary | ICD-10-CM

## 2016-08-06 DIAGNOSIS — Z1151 Encounter for screening for human papillomavirus (HPV): Secondary | ICD-10-CM

## 2016-08-06 DIAGNOSIS — N920 Excessive and frequent menstruation with regular cycle: Secondary | ICD-10-CM

## 2016-08-06 MED ORDER — NORETHINDRONE 0.35 MG PO TABS: 1.0000 | ORAL_TABLET | Freq: Every day | ORAL | 4 refills | Status: DC

## 2016-08-06 NOTE — Progress Notes (Signed)
Gina Bauer 10/02/1970 [id number]   History:    46 y.o. [id number] Married.  Vasectomy.  Boys 6, 57, 45 yo.  Patient doing a Restaurant manager, fast food in Psychology and teaching  RP:  Established patient presenting for annual gyn exam   Very busy schedule!!!  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  Had palpitations, cardiac investigation neg.  Well on Progestin-only pill but flow still on heavy side, reg qmonth.  Menstrual migraines recurred lately.  No pelvic pain.  No vaginal d/c.  Breasts wnl.    Past medical history,surgical history, family history and social history were all reviewed and documented in the EPIC chart.  Gynecologic History Patient's last menstrual period was 07/28/2016. Contraception: vasectomy Last Pap: 2017. Results were: normal Last mammogram: 01/2016. Results were: normal  Obstetric History OB History  Gravida Para Term Preterm AB Living  6 3       3   SAB TAB Ectopic Multiple Live Births               # Outcome Date GA Lbr Len/2nd Weight Sex Delivery Anes PTL Lv  6 Gravida           5 Gravida           4 Gravida           3 Para           2 Para           1 Para                ROS: A ROS was performed and pertinent positives and negatives are included in the history.  GENERAL: No fevers or chills. HEENT: No change in vision, no earache, sore throat or sinus congestion. NECK: No pain or stiffness. CARDIOVASCULAR: No chest pain or pressure. No palpitations. PULMONARY: No shortness of breath, cough or wheeze. GASTROINTESTINAL: No abdominal pain, nausea, vomiting or diarrhea, melena or bright red blood per rectum. GENITOURINARY: No urinary frequency, urgency, hesitancy or dysuria. MUSCULOSKELETAL: No joint or muscle pain, no back pain, no recent trauma. DERMATOLOGIC: No rash, no itching, no lesions. ENDOCRINE: No polyuria, polydipsia, no heat or cold intolerance. No recent change in weight. HEMATOLOGICAL: No anemia or easy bruising or  bleeding. NEUROLOGIC: No headache, seizures, numbness, tingling or weakness. PSYCHIATRIC: No depression, no loss of interest in normal activity or change in sleep pattern.     Exam:   BP 112/70   Ht 5\' 2"  (1.575 m)   Wt 120 lb (54.4 kg)   LMP 07/28/2016   BMI 21.95 kg/m   Body mass index is 21.95 kg/m.  General appearance : Well developed well nourished female. No acute distress HEENT: Eyes: no retinal hemorrhage or exudates,  Neck supple, trachea midline, no carotid bruits, no thyroidmegaly Lungs: Clear to auscultation, no rhonchi or wheezes, or rib retractions  Heart: Regular rate and rhythm, no murmurs or gallops Breast:Examined in sitting and supine position were symmetrical in appearance, no palpable masses or tenderness,  no skin retraction, no nipple inversion, no nipple discharge, no skin discoloration, no axillary or supraclavicular lymphadenopathy Abdomen: no palpable masses or tenderness, no rebound or guarding Extremities: no edema or skin discoloration or tenderness  Pelvic:  Bartholin, Urethra, Skene Glands: Within normal limits             Vagina: No gross lesions or discharge  Cervix: No gross lesions or discharge.  Pap/HPV done.  Uterus  AV, normal  size, shape and consistency, non-tender and mobile  Adnexa  Without masses or tenderness  Anus and perineum  normal    Assessment/Plan:  46 y.o. female for annual exam   1. Encounter for routine gynecological examination with Papanicolaou smear of cervix Normal Gyn exam.  Pap/HPV done.  2. Menorrhagia with regular cycle Decision to continue with Progestin-Only pill.  May consider Progestin IUD or Endometrial Ablation in the future.  3. Menstrual migraine without status migrainosus, not intractable Considering taking a few months off the Progestin pill to see if better or worse.  [name] MD, 12:03 PM 08/06/2016

## 2016-08-06 NOTE — Patient Instructions (Signed)
1. Encounter for routine gynecological examination with Papanicolaou smear of cervix Normal Gyn exam.  Pap/HPV done.  2. Menorrhagia with regular cycle Decision to continue with Progestin-Only pill.  May consider Progestin IUD or Endometrial Ablation in the future.  3. Menstrual migraine without status migrainosus, not intractable Considering taking a few months off the Progestin pill to see if better or worse.  Gina Bauer, it was a pleasure to see you today!  I will inform you of the Pap result as soon as available.  Please call back if you would like to consider the Endometrial Ablation or the Progestin IUD.

## 2016-08-10 LAB — PAP, TP IMAGING W/ HPV RNA, RFLX HPV TYPE 16,18/45: HPV mRNA, High Risk: NOT DETECTED

## 2016-12-14 DIAGNOSIS — G43009 Migraine without aura, not intractable, without status migrainosus: Secondary | ICD-10-CM | POA: Insufficient documentation

## 2016-12-29 ENCOUNTER — Telehealth: Payer: Self-pay | Admitting: *Deleted

## 2016-12-29 MED ORDER — NORETHINDRONE 0.35 MG PO TABS: 1.0000 | ORAL_TABLET | Freq: Every day | ORAL | 4 refills | Status: DC

## 2016-12-29 NOTE — Telephone Encounter (Signed)
Pt called stating she has a new pharmacy and would like [facility]. Rx sent.

## 2017-03-09 ENCOUNTER — Encounter: Payer: Self-pay | Admitting: Obstetrics & Gynecology

## 2017-08-10 ENCOUNTER — Encounter: Payer: BLUE CROSS/BLUE SHIELD | Admitting: Obstetrics & Gynecology

## 2017-09-14 ENCOUNTER — Ambulatory Visit (INDEPENDENT_AMBULATORY_CARE_PROVIDER_SITE_OTHER): Payer: BLUE CROSS/BLUE SHIELD | Admitting: Obstetrics & Gynecology

## 2017-09-14 ENCOUNTER — Encounter: Payer: Self-pay | Admitting: Obstetrics & Gynecology

## 2017-09-14 VITALS — BP 126/82 | Ht 62.0 in | Wt 122.0 lb

## 2017-09-14 DIAGNOSIS — Z9189 Other specified personal risk factors, not elsewhere classified: Secondary | ICD-10-CM

## 2017-09-14 DIAGNOSIS — Z01419 Encounter for gynecological examination (general) (routine) without abnormal findings: Secondary | ICD-10-CM

## 2017-09-14 NOTE — Progress Notes (Signed)
Gina Bauer December 08, 1970 [id number]   History:    47 y.o. [id number] Married.  Vasectomy.  Working and doing a Education officer, community.  RP:  Established patient presenting for annual gyn exam   HPI: Menses regular normal every 26 days.  No breakthrough bleeding.  No pelvic pain.  Normal vaginal secretions.  No pain with intercourse.  Urine and bowel movements normal.  Breasts normal.  Body mass index 22.31.  Walks regularly.  Health labs with family physician, next visit plan December 2019.  Past medical history,surgical history, family history and social history were all reviewed and documented in the EPIC chart.  Gynecologic History Patient's last menstrual period was 09/10/2017. Contraception: vasectomy Last Pap: 07/2016. Results were: Negative/HPV HR neg Last mammogram: 02/2017. Results were: Negative Bone Density: Never Colonoscopy: Never  Obstetric History OB History  Gravida Para Term Preterm AB Living  6 3       3   SAB TAB Ectopic Multiple Live Births               # Outcome Date GA Lbr Len/2nd Weight Sex Delivery Anes PTL Lv  6 Gravida           5 Gravida           4 Gravida           3 Para           2 Para           1 Para              ROS: A ROS was performed and pertinent positives and negatives are included in the history.  GENERAL: No fevers or chills. HEENT: No change in vision, no earache, sore throat or sinus congestion. NECK: No pain or stiffness. CARDIOVASCULAR: No chest pain or pressure. No palpitations. PULMONARY: No shortness of breath, cough or wheeze. GASTROINTESTINAL: No abdominal pain, nausea, vomiting or diarrhea, melena or bright red blood per rectum. GENITOURINARY: No urinary frequency, urgency, hesitancy or dysuria. MUSCULOSKELETAL: No joint or muscle pain, no back pain, no recent trauma. DERMATOLOGIC: No rash, no itching, no lesions. ENDOCRINE: No polyuria, polydipsia, no heat or cold intolerance. No recent change in weight. HEMATOLOGICAL: No anemia or  easy bruising or bleeding. NEUROLOGIC: No headache, seizures, numbness, tingling or weakness. PSYCHIATRIC: No depression, no loss of interest in normal activity or change in sleep pattern.     Exam:   BP 126/82   Ht 5\' 2"  (1.575 m)   Wt 122 lb (55.3 kg)   LMP 09/10/2017   BMI 22.31 kg/m   Body mass index is 22.31 kg/m.  General appearance : Well developed well nourished female. No acute distress HEENT: Eyes: no retinal hemorrhage or exudates,  Neck supple, trachea midline, no carotid bruits, no thyroidmegaly Lungs: Clear to auscultation, no rhonchi or wheezes, or rib retractions  Heart: Regular rate and rhythm, no murmurs or gallops Breast:Examined in sitting and supine position were symmetrical in appearance, no palpable masses or tenderness,  no skin retraction, no nipple inversion, no nipple discharge, no skin discoloration, no axillary or supraclavicular lymphadenopathy Abdomen: no palpable masses or tenderness, no rebound or guarding Extremities: no edema or skin discoloration or tenderness  Pelvic: Vulva: Normal             Vagina: No gross lesions or discharge  Cervix: No gross lesions or discharge.    Uterus  AV, normal size, shape and consistency, non-tender and mobile  Adnexa  Without  masses or tenderness  Anus: Normal   Assessment/Plan:  47 y.o. female for annual exam   1. Well female exam with routine gynecological exam Normal gynecologic exam.  Pap test negative with negative high risk HPV May 2018.  Will repeat Pap test at 2 to 3 years.  Breast exam normal.  Last mammogram negative in December 2018.  Body mass index normal at 22.31.  Health labs with family physician, next visit December 2019.  2. Relies on partner vasectomy for contraception   [name] MD, 2:32 PM 09/14/2017

## 2017-09-14 NOTE — Patient Instructions (Signed)
1. Well female exam with routine gynecological exam Normal gynecologic exam.  Pap test negative with negative high risk HPV May 2018.  Will repeat Pap test at 2 to 3 years.  Breast exam normal.  Last mammogram negative in December 2018.  Body mass index normal at 22.31.  Health labs with family physician, next visit December 2019.  2. Relies on partner vasectomy for contraception  Reagen, it was a pleasure seeing you today!

## 2017-12-29 ENCOUNTER — Ambulatory Visit: Payer: BLUE CROSS/BLUE SHIELD | Admitting: Psychiatry

## 2017-12-29 DIAGNOSIS — F4323 Adjustment disorder with mixed anxiety and depressed mood: Secondary | ICD-10-CM

## 2017-12-29 NOTE — Progress Notes (Signed)
      [facility] Counselor/Therapist Progress Note   Patient ID: Gina Bauer, MRN: [id number]  Date: 12/29/2017  Timespent: 50 minutes  Treatment Type: Individual  Subjective: Client comes in anxious and stressed today she is having difficulty setting boundaries with her children and husband. Her complaint is that when she asks them for help she gets pushed back that implies that she is less than.  This offends the client.  It causes her to shut down and withdraw.  We discussed being more assertive and asking for what she wants.  Clearly identifying with her family that her request are appropriate and that compliance with them does not mean that she is a bad person.  The client will try to set these boundaries and be more assertive with her sons and husband. 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  She is just finishing a course in the Masters of Counseling degree at [employer].  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  The client agrees that at the beginning of the program she thought it was impossible, yet she has made it this far.  We discussed mindfulness staying in the present tense and not catastrophizein about the future because this increases her anxiety.  The client agrees.  She will try to take it one day to time, one assignment at a time.  Interventions:Solution Focused, Counsellor, Psychosocial Skills: Boundaries, Supportive and Other: EMDR  Mental Status Exam:   Appearance:   Well Groomed     Behavior:  Appropriate  Motor:  Normal  Speech/Language:   Clear and Coherent  Affect:  Appropriate  Mood:  anxious  Thought process:  Coherent   Thought content:    Logical  Perceptual disturbances:    Normal  Orientation:  Full (Time, Place, and Person)  Attention:  Good  Concentration:  good  Memory:  Immediate  Fund of knowledge:   Good  Insight:    Good  Judgment:   Good  Impulse Control:  good    Reported Symptoms: Anxiety, stress  Risk Assessment: Danger to Self:  No Self-injurious Behavior: No Danger to Others: No Duty to Warn:no Physical Aggression / Violence:No  Access to Firearms a concern: No  Gang Involvement:No   Diagnosis:   ICD-10-CM   1. Adjustment disorder with mixed anxiety and depressed mood F43.23      Plan: Mindfulness, boundaries, assertiveness  Jacoya Bauman, [place]

## 2018-02-01 ENCOUNTER — Encounter (INDEPENDENT_AMBULATORY_CARE_PROVIDER_SITE_OTHER): Payer: Self-pay

## 2018-02-01 ENCOUNTER — Ambulatory Visit: Payer: BLUE CROSS/BLUE SHIELD | Admitting: Psychiatry

## 2018-02-01 ENCOUNTER — Encounter

## 2018-02-01 DIAGNOSIS — F4323 Adjustment disorder with mixed anxiety and depressed mood: Secondary | ICD-10-CM | POA: Diagnosis not present

## 2018-02-01 NOTE — Progress Notes (Signed)
      [facility] Counselor/Therapist Progress Note   Patient ID: Gina Bauer, MRN: [id number]  Date: 02/01/2018  Timespent: 57 minutes   Treatment Type: Individual   Reported Symptoms: stress   Mental Status Exam:    Appearance:   Well Groomed     Behavior:  Appropriate  Motor:  Normal  Speech/Language:   Clear and Coherent  Affect:  Appropriate  Mood:  anxious  Thought process:  normal  Thought content:    WNL  Sensory/Perceptual disturbances:    WNL  Orientation:  oriented to person, place, time/date and situation  Attention:  Good  Concentration:  Good  Memory:  WNL  Fund of knowledge:   Good  Insight:    Good  Judgment:   Good  Impulse Control:  Good     Risk Assessment: Danger to Self:  No Self-injurious Behavior: No Danger to Others: No Duty to Warn:no Physical Aggression / Violence:No  Access to Firearms a concern: No  Gang Involvement:No    Subjective: The client reports that she has 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  This is been a very positive development for the client.  She is heading towards the last year of her masters in counseling and is very encouraged with how things are turning out. Client recently took a vacation with some girlfriends.  She found 1 of the women to be very invasive and narcissistic.  She discussed the difficulty she had setting the boundaries but was able to successfully do so ultimately. We discussed that with a narcissist you can make them angry but it is really hard to hurt their feelings.  You need to be very directed to the point and set firm clear boundaries about what she will or will not accept.  The client agreed and feels ready to set more of those boundaries. We also discussed thinking errors and what that can look like in her family.  She describes that sometimes her husband will do a lot of tasks and then expect a lot of affirmation.  If he  does not get that he then gets angry and upset.  We discussed mind reading and I gave the client the thinking errors handout to review and go over with her husband.  She agreed to do so.   Interventions: Assertiveness/Communication, Solution-Oriented/Positive Psychology and Insight-Oriented   Diagnosis:   ICD-10-CM   1. Adjustment disorder with mixed anxiety and depressed mood F43.23      Plan: Thinking errors review, boundaries.   Gina Bauer, [place]

## 2018-03-29 ENCOUNTER — Encounter: Payer: Self-pay | Admitting: Psychiatry

## 2018-03-29 ENCOUNTER — Ambulatory Visit: Payer: BLUE CROSS/BLUE SHIELD | Admitting: Psychiatry

## 2018-03-29 ENCOUNTER — Encounter

## 2018-03-29 DIAGNOSIS — F4323 Adjustment disorder with mixed anxiety and depressed mood: Secondary | ICD-10-CM | POA: Diagnosis not present

## 2018-03-29 NOTE — Progress Notes (Signed)
      [facility] Counselor/Therapist Progress Note  Patient ID: MEILING HENDRIKS, MRN: [id number],    Date: 03/29/2018  Time Spent: 48 minutes   Treatment Type: Individual Therapy  Reported Symptoms: Anxious Mood  Mental Status Exam:  Appearance:   Well Groomed     Behavior:  Appropriate  Motor:  Normal  Speech/Language:   Clear and Coherent  Affect:  Appropriate  Mood:  anxious  Thought process:  normal  Thought content:    WNL  Sensory/Perceptual disturbances:    WNL  Orientation:  oriented to person, place, time/date and situation  Attention:  Good  Concentration:  Good  Memory:  WNL  Fund of knowledge:   Good  Insight:    Good  Judgment:   Good  Impulse Control:  Good   Risk Assessment: Danger to Self:  No Self-injurious Behavior: No Danger to Others: No Duty to Warn:no Physical Aggression / Violence:No  Access to Firearms a concern: No  Gang Involvement:No   Subjective: The client reports that she just found out her son's girlfriend disclosed that she had lied about her age.  "I have never felt comfortable with that girl."  The client feels that her son should not continue to date her since she had lied about her age.  The client states she will talk with her son with her husband tonight.  She also discussed his traffic ticket and the impact it had on the family with him not being able to drive.  "It has been frustrating."  The client disclosed that [employer] had fired her unexpectedly.  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  Her masters degree was flagged by one of the auditor's.  The auditor felt that a counseling psychology masters degree was not psychology.  Disregarding the fact that her degree does have to do with psychology, the college handled her termination very poorly.  The administrative supervisor that she talked to was rude and aggressive with her.  The client is still processing what happened but ultimately is grateful  that she is not needing to work there anymore.  At next session the client wants to work on how to stop biting her nails.  Interventions: Assertiveness/Communication, Solution-Oriented/Positive Psychology and Insight-Oriented  Diagnosis:   ICD-10-CM   1. Adjustment disorder with mixed anxiety and depressed mood F43.23     Plan: Self-care, assertiveness, boundaries.  Albertina Parr Jeraline Marcinek, [place]

## 2018-04-26 ENCOUNTER — Encounter: Payer: Self-pay | Admitting: Psychiatry

## 2018-04-26 ENCOUNTER — Ambulatory Visit: Payer: BLUE CROSS/BLUE SHIELD | Admitting: Psychiatry

## 2018-04-26 DIAGNOSIS — F4323 Adjustment disorder with mixed anxiety and depressed mood: Secondary | ICD-10-CM | POA: Diagnosis not present

## 2018-04-26 NOTE — Progress Notes (Signed)
      [facility] Counselor/Therapist Progress Note  Patient ID: Gina Bauer, MRN: [id number],    Date: 04/26/2018  Time Spent: 57 minutes   Treatment Type: Individual Therapy  Reported Symptoms: Anxious Mood  Mental Status Exam:  Appearance:   Casual and Well Groomed     Behavior:  Appropriate  Motor:  Normal  Speech/Language:   Clear and Coherent  Affect:  Appropriate  Mood:  anxious  Thought process:  normal  Thought content:    WNL  Sensory/Perceptual disturbances:    WNL  Orientation:  oriented to person, place, time/date and situation  Attention:  Good  Concentration:  Good  Memory:  WNL  Fund of knowledge:   Good  Insight:    Good  Judgment:   Good  Impulse Control:  Good   Risk Assessment: Danger to Self:  No Self-injurious Behavior: No Danger to Others: No Duty to Warn:no Physical Aggression / Violence:No  Access to Firearms a concern: No  Gang Involvement:No   Subjective: The client reports that she came in on her high school son and his girlfriend half naked making out in his bedroom.  She was very angry with him as she and her husband had told him he could not be in the house alone with her.  They were able to talk through what happened and set the appropriate boundaries. Today the client wanted to work on her problem with nailbiting.  She realizes that it is a stress response that she has been doing since she was a young child.  I used EMDR with the client to focus on her nailbiting.  She feels very annoyed by it and her negative cognition was, "I cannot control it."  The client states that she remembers as a child that her mother would get sick and have to go to the hospital.  There was a lot of uncertainty and she had resorted to biting her nails as a response.  As the client continued to process I asked her as an adult if she still powerless?  She said no she knows that she can handle most anything that comes across her plate.  She either has the resources  the knowledge or can access what she needs to solve it.  At the end of the session her subjective units of distress went from a 5+ to less than 3.  I encouraged the client to focus on making sure she was always holding something in her hands to keep them from going up into her mouth.  She agreed to try this.  Interventions: Solution-Oriented/Positive Psychology, Eye Movement Desensitization and Reprocessing (EMDR) and Insight-Oriented  Diagnosis:   ICD-10-CM   1. Adjustment disorder with mixed anxiety and depressed mood F43.23     Plan: Hold something in her hands, positive self talk, exercise.  Gina Bauer, [place]

## 2018-06-03 ENCOUNTER — Ambulatory Visit: Payer: BLUE CROSS/BLUE SHIELD | Admitting: Psychiatry

## 2018-06-17 ENCOUNTER — Ambulatory Visit (INDEPENDENT_AMBULATORY_CARE_PROVIDER_SITE_OTHER): Payer: BLUE CROSS/BLUE SHIELD | Admitting: Psychiatry

## 2018-06-17 ENCOUNTER — Encounter: Payer: Self-pay | Admitting: Psychiatry

## 2018-06-17 DIAGNOSIS — F4323 Adjustment disorder with mixed anxiety and depressed mood: Secondary | ICD-10-CM

## 2018-06-17 NOTE — Progress Notes (Signed)
      [facility] Counselor/Therapist Progress Note  Patient ID: Gina Bauer, MRN: [id number],    Date: 06/17/2018  Time Spent: 57 minutes, 2:00 to 2:57 pm   Treatment Type: Individual Therapy  Reported Symptoms: anxiety, frustration  Mental Status Exam:  Appearance:   Casual and Well Groomed     Behavior:  Appropriate  Motor:  Normal  Speech/Language:   Clear and Coherent  Affect:  Appropriate  Mood:  anxious  Thought process:  normal  Thought content:    WNL  Sensory/Perceptual disturbances:    WNL  Orientation:  oriented to person, place, time/date and situation  Attention:  Good  Concentration:  Good  Memory:  WNL  Fund of knowledge:   Good  Insight:    Good  Judgment:   Good  Impulse Control:  Good   Risk Assessment: Danger to Self:  No Self-injurious Behavior: No Danger to Others: No Duty to Warn:no Physical Aggression / Violence:No  Access to Firearms a concern: No  Gang Involvement:No   Subjective: I connected with patient by a video enabled telemedicine application or telephone, with their informed consent, and verified patient privacy and that I am speaking with the correct person using two identifiers.  I was located at [facility] group and patient at home. The client is preparing for the sheltering place order that begins at 5 PM today.  She describes a higher anxiety because her whole schedule has been disrupted.  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 We discussed developing a daily schedule as a way to bring order out of the chaos.  We also discussed increasing her cardio workout.  Rather than just walking to begin running as a way to decrease her overall stress and anxiety.  The client agreed. We also discussed radical acceptance and trying to stay in the present tense i.e. be  mindful.  Interventions: Assertiveness/Communication, Solution-Oriented/Positive Psychology and Insight-Oriented  Diagnosis:   ICD-10-CM   1. Adjustment disorder with mixed anxiety and depressed mood F43.23     Plan: Exercise, mindfulness, radical acceptance, positive self talk.  This record has been created using [employer].  Chart creation errors have been sought, but Byrl Latin not always have been located and corrected. Such creation errors do not reflect on the standard of medical care.   Anelia Carriveau, [place]

## 2018-06-23 ENCOUNTER — Ambulatory Visit (INDEPENDENT_AMBULATORY_CARE_PROVIDER_SITE_OTHER): Payer: BLUE CROSS/BLUE SHIELD | Admitting: Obstetrics & Gynecology

## 2018-06-23 ENCOUNTER — Encounter: Payer: Self-pay | Admitting: Obstetrics & Gynecology

## 2018-06-23 ENCOUNTER — Other Ambulatory Visit: Payer: Self-pay

## 2018-06-23 VITALS — BP 122/76

## 2018-06-23 DIAGNOSIS — N921 Excessive and frequent menstruation with irregular cycle: Secondary | ICD-10-CM

## 2018-06-23 MED ORDER — NORGESTIMATE-ETH ESTRADIOL 0.25-35 MG-MCG PO TABS: 1.0000 | ORAL_TABLET | Freq: Every day | ORAL | 4 refills | Status: DC

## 2018-06-23 NOTE — Progress Notes (Signed)
    Gina Bauer 1970/08/29 [id number]        48 y.o.  [id number] Married.  Vasectomy.   RP: Menometrorrhagia x 2 weeks with lightheadedness x last night  HPI: Longstanding heavy menses every month, but this last menstrual period was longer, started 2 weeks ago with heavier bleeding the 2nd week, felt dizzy last night and this morning.  Last Hb 11.9 in 02/2018.  No pelvic pain.  No abnormal vaginal discharge.  No fever.  Urine/BMs normal.  Vasectomy.   OB History  Gravida Para Term Preterm AB Living  6 3       3   SAB TAB Ectopic Multiple Live Births               # Outcome Date GA Lbr Len/2nd Weight Sex Delivery Anes PTL Lv  6 Gravida           5 Gravida           4 Gravida           3 Para           2 Para           1 Para             Past medical history,surgical history, problem list, medications, allergies, family history and social history were all reviewed and documented in the EPIC chart.   Directed ROS with pertinent positives and negatives documented in the history of present illness/assessment and plan.  Exam:  Vitals:   06/23/18 1236  BP: 122/76   General appearance:  Normal  Abdomen: Normal  Gynecologic exam: Vulva normal.  Bimanual exam:  Uterus AV, normal volume, NT, mobile.  No adnexal mass, NT. Mild dark menstrual blood.   Assessment/Plan:  48 y.o. G6P3   1. Menometrorrhagia Longstanding menorrhagia with now worsening menometrorrhagia with feeling of dizziness.  Normal gynecologic exam with no heavy bleeding currently.  Will investigate with a CBC, TSH and prolactin today.  Patient will follow-up for pelvic ultrasound to evaluate the endometrial lining and rule out endometrial polyps, fibroids, endometrial hyperplasia and endometrial cancer.  Will start on the generic of Ortho-Cyclen to stop the bleeding and control the menometrorrhagia until follow-up pelvic ultrasound.  No contraindication to birth control pills.  Usage, risks and benefits reviewed.   Prescription sent to pharmacy.  Will continue with iron supplements and iron rich nutrition.  Recommend good water hydration.  Patient voiced understanding and agreement with plan. - CBC - TSH - Prolactin - US Transvaginal Non-OB; Future  Other orders - norgestimate-ethinyl estradiol (ORTHO-CYCLEN,SPRINTEC,PREVIFEM) 0.25-35 MG-MCG tablet; Take 1 tablet by mouth daily. May take 2 tab daily until heavy bleeding stops, then 1 tab daily.  Counseling on above issues and coordination of care more than 50% for 25 minutes.  [name] MD, 12:40 PM 06/23/2018

## 2018-06-23 NOTE — Patient Instructions (Signed)
  1. Menometrorrhagia Longstanding menorrhagia with now worsening menometrorrhagia with feeling of dizziness.  Normal gynecologic exam with no heavy bleeding currently.  Will investigate with a CBC, TSH and prolactin today.  Patient will follow-up for pelvic ultrasound to evaluate the endometrial lining and rule out endometrial polyps, fibroids, endometrial hyperplasia and endometrial cancer.  Will start on the generic of Ortho-Cyclen to stop the bleeding and control the menometrorrhagia until follow-up pelvic ultrasound.  No contraindication to birth control pills.  Usage, risks and benefits reviewed.  Prescription sent to pharmacy.  Will continue with iron supplements and iron rich nutrition.  Recommend good water hydration.  Patient voiced understanding and agreement with plan. - CBC - TSH - Prolactin - US Transvaginal Non-OB; Future  Other orders - norgestimate-ethinyl estradiol (ORTHO-CYCLEN,SPRINTEC,PREVIFEM) 0.25-35 MG-MCG tablet; Take 1 tablet by mouth daily. May take 2 tab daily until heavy bleeding stops, then 1 tab daily.  Arienna, it was a pleasure seeing you today!  I will inform you of your results as soon as they are available.

## 2018-06-23 NOTE — Telephone Encounter (Signed)
Patient needs OV due to irregular bleeding

## 2018-06-24 ENCOUNTER — Encounter: Payer: Self-pay | Admitting: *Deleted

## 2018-06-24 LAB — PROLACTIN: Prolactin: 11 ng/mL

## 2018-06-24 LAB — CBC
HCT: 32.2 % — ABNORMAL LOW (ref 35.0–45.0)
Hemoglobin: 10.5 g/dL — ABNORMAL LOW (ref 11.7–15.5)
MCH: 28.6 pg (ref 27.0–33.0)
MCHC: 32.6 g/dL (ref 32.0–36.0)
MCV: 87.7 fL (ref 80.0–100.0)
MPV: 11 fL (ref 7.5–12.5)
Platelets: 328 10*3/uL (ref 140–400)
RBC: 3.67 10*6/uL — ABNORMAL LOW (ref 3.80–5.10)
RDW: 14.7 % (ref 11.0–15.0)
WBC: 7.6 10*3/uL (ref 3.8–10.8)

## 2018-06-24 LAB — TSH: TSH: 2.85 mIU/L

## 2018-06-28 ENCOUNTER — Other Ambulatory Visit: Payer: Self-pay

## 2018-06-30 ENCOUNTER — Other Ambulatory Visit: Payer: Self-pay

## 2018-06-30 ENCOUNTER — Encounter: Payer: Self-pay | Admitting: Obstetrics & Gynecology

## 2018-06-30 ENCOUNTER — Ambulatory Visit (INDEPENDENT_AMBULATORY_CARE_PROVIDER_SITE_OTHER): Payer: BLUE CROSS/BLUE SHIELD | Admitting: Obstetrics & Gynecology

## 2018-06-30 ENCOUNTER — Ambulatory Visit (INDEPENDENT_AMBULATORY_CARE_PROVIDER_SITE_OTHER): Payer: BLUE CROSS/BLUE SHIELD

## 2018-06-30 DIAGNOSIS — N921 Excessive and frequent menstruation with irregular cycle: Secondary | ICD-10-CM

## 2018-06-30 DIAGNOSIS — D500 Iron deficiency anemia secondary to blood loss (chronic): Secondary | ICD-10-CM

## 2018-06-30 DIAGNOSIS — N920 Excessive and frequent menstruation with regular cycle: Secondary | ICD-10-CM

## 2018-06-30 DIAGNOSIS — D219 Benign neoplasm of connective and other soft tissue, unspecified: Secondary | ICD-10-CM

## 2018-06-30 NOTE — Patient Instructions (Signed)
1. Menorrhagia with regular cycle Pelvic ultrasound findings reviewed with patient and.  Several very small intramural fibroids are present and the endometrial lining appears normal but slightly thickened at 12.6 mm.  Small amount of fluid and debris's in the cavity given that patient is currently bleeding mildly.  The left ovary is normal in the right ovary present a small functional cyst at 2.8 cm.  Given that a surgery cannot be schedule right now and the circumstances of the coronavirus crisis, the patient was offered an endometrial biopsy today to rule out endometrial hyperplasia and cancer.  The decision was taken instead to follow-up with a pelvic US to reevaluate the Endometrial line at end of May 2020 while patient is on birth control pills.  At that visit long-term management will be further discussed with patient.  The possibility of using a progesterone IUD to control the menorrhagia was discussed.  Patient would prefer proceeding with an endometrial Ablation.  Information on endometrial ablation was discussed with patient and a pamphlet was given.  We will probably proceed with a hysteroscopy/D&C/NovaSure endometrial ablation, the final decision is to be made at the next visit. - US Transvaginal Non-OB; Future  2. Fibroids Small intramural fibroids present. - US Transvaginal Non-OB; Future  3. Iron deficiency anemia due to chronic blood loss Will control menorrhagia with the birth control pill and patient will have a high iron nutrition combined with iron sulfate 325 mg twice a day.  Sunni, it was a pleasure seeing you today!

## 2018-06-30 NOTE — Progress Notes (Signed)
Gina Bauer 02-17-1971 [id number]        48 y.o.  [id number]  Married.  Vasectomy  RP: Menorrhagia with secondary anemia for pelvic ultrasound  HPI: Severe longstanding menorrhagia worsening in the past year with secondary anemia.  Hemoglobin on June 23, 2018 was at 10.5.  It was previously at 9.7 in April 2018.  Patient was started on Sprintec April 2 to control her menorrhagia.  She has been taking 2 tablets daily and now the bleeding is minimal.  No pelvic pain.  No abnormal vaginal discharge.  No fever.  Urine and bowel movements normal.  No pain with intercourse.  Vasectomy.   OB History  Gravida Para Term Preterm AB Living  6 3       3   SAB TAB Ectopic Multiple Live Births               # Outcome Date GA Lbr Len/2nd Weight Sex Delivery Anes PTL Lv  6 Gravida           5 Gravida           4 Gravida           3 Para           2 Para           1 Para             Past medical history,surgical history, problem list, medications, allergies, family history and social history were all reviewed and documented in the EPIC chart.   Directed ROS with pertinent positives and negatives documented in the history of present illness/assessment and plan.  Exam:  There were no vitals filed for this visit. General appearance:  Normal  Pelvic US today: T/V images.  Anteverted uterus measuring 10.01 x 6.91 x 5.75 cm.  Several subcentimeter intramural fibroids.  Slightly thickened but symmetrical endometrium measured at 12.6 mm with small amount of fluid/debris's and cavity, no obvious mass (patient bleeding).  Left ovary atrophic in appearance.  Right ovary with a 2.8 x 2.2 cm cyst with debris's, tender to palpation.  No free fluid in the posterior cul-de-sac.   Assessment/Plan:  48 y.o. G6P3   1. Menorrhagia with regular cycle Pelvic ultrasound findings reviewed with patient and.  Several very small intramural fibroids are present and the endometrial lining appears normal but slightly  thickened at 12.6 mm.  Small amount of fluid and debris's in the cavity given that patient is currently bleeding mildly.  The left ovary is normal in the right ovary present a small functional cyst at 2.8 cm.  Given that a surgery cannot be schedule right now and the circumstances of the coronavirus crisis, the patient was offered an endometrial biopsy today to rule out endometrial hyperplasia and cancer.  The decision was taken instead to follow-up with a pelvic US to reevaluate the Endometrial line at end of May 2020 while patient is on birth control pills.  At that visit long-term management will be further discussed with patient.  The possibility of using a progesterone IUD to control the menorrhagia was discussed.  Patient would prefer proceeding with an endometrial Ablation.  Information on endometrial ablation was discussed with patient and a pamphlet was given.  We will probably proceed with a hysteroscopy/D&C/NovaSure endometrial ablation, the final decision is to be made at the next visit. - US Transvaginal Non-OB; Future  2. Fibroids Small intramural fibroids present. - US Transvaginal Non-OB; Future  3. Iron deficiency anemia due  to chronic blood loss Will control menorrhagia with the birth control pill and patient will have a high iron nutrition combined with iron sulfate 325 mg twice a day.  Counseling on above issues and coordination of care more than 50% for 15 minutes.  [name] MD, 12:33 PM 06/30/2018

## 2018-07-08 ENCOUNTER — Other Ambulatory Visit: Payer: Self-pay

## 2018-07-08 ENCOUNTER — Ambulatory Visit: Payer: BLUE CROSS/BLUE SHIELD | Admitting: Psychiatry

## 2018-07-22 ENCOUNTER — Ambulatory Visit: Payer: BLUE CROSS/BLUE SHIELD | Admitting: Psychiatry

## 2018-07-22 ENCOUNTER — Other Ambulatory Visit: Payer: Self-pay

## 2018-07-27 ENCOUNTER — Other Ambulatory Visit: Payer: Self-pay

## 2018-07-28 ENCOUNTER — Ambulatory Visit (INDEPENDENT_AMBULATORY_CARE_PROVIDER_SITE_OTHER): Payer: BLUE CROSS/BLUE SHIELD

## 2018-07-28 ENCOUNTER — Ambulatory Visit: Payer: BLUE CROSS/BLUE SHIELD | Admitting: Obstetrics & Gynecology

## 2018-07-28 DIAGNOSIS — N920 Excessive and frequent menstruation with regular cycle: Secondary | ICD-10-CM | POA: Diagnosis not present

## 2018-07-28 DIAGNOSIS — N83201 Unspecified ovarian cyst, right side: Secondary | ICD-10-CM | POA: Diagnosis not present

## 2018-07-28 DIAGNOSIS — N83291 Other ovarian cyst, right side: Secondary | ICD-10-CM | POA: Diagnosis not present

## 2018-07-28 DIAGNOSIS — D219 Benign neoplasm of connective and other soft tissue, unspecified: Secondary | ICD-10-CM | POA: Diagnosis not present

## 2018-07-28 NOTE — Progress Notes (Signed)
    Gina Bauer [birth year] [id number]        48 y.o.  [id number] Married  RP: Menorrhagia/Rt Ovarian Cyst for Pelvic US  HPI: Well on [place] since early April 2020.  No heavy menses or breakthrough bleeding since started on birth control pills.  No pelvic pain.   OB History  Gravida Para Term Preterm AB Living  6 3       3   SAB TAB Ectopic Multiple Live Births               # Outcome Date GA Lbr Len/2nd Weight Sex Delivery Anes PTL Lv  6 Gravida           5 Gravida           4 Gravida           3 Para           2 Para           1 Para             Past medical history,surgical history, problem list, medications, allergies, family history and social history were all reviewed and documented in the EPIC chart.   Directed ROS with pertinent positives and negatives documented in the history of present illness/assessment and plan.  Exam:  There were no vitals filed for this visit. General appearance:  Normal  Pelvic US today: T/V images.  Compared with previous pelvic ultrasound done on June 30, 2018.  Uterus with small fibroids unchanged.  Endometrial lining is symmetrical and thinner measured at 4.0 mm.  Ovaries are within normal limits with previous cystic structure on the right ovary resolved.  No free fluid in the posterior cul-de-sac.   Assessment/Plan:  48 y.o. G6P3   1. Menorrhagia with regular cycle Pelvic US findings reviewed, endometrial line thin and normal at 4 mm.  Uterus and ovaries normal.  Well on Sprintec birth control pills with no abnormal bleeding since started.  No contraindication to continue on birth control pills.  Decision to continue on Sprintec rather than proceed with Endometrial Ablation at this point.  Patient agrees with plan.  2. Right ovarian cyst Resolved right ovarian cyst.  Patient reassured.  Counseling on above issues and coordination of care more than 50% for 15 minutes.  [name] MD, 8:52 AM 07/28/2018

## 2018-07-29 ENCOUNTER — Encounter: Payer: Self-pay | Admitting: Obstetrics & Gynecology

## 2018-07-29 NOTE — Patient Instructions (Signed)
1. Menorrhagia with regular cycle Pelvic US findings reviewed, endometrial line thin and normal at 4 mm.  Uterus and ovaries normal.  Well on Sprintec birth control pills with no abnormal bleeding since started.  No contraindication to continue on birth control pills.  Decision to continue on Sprintec rather than proceed with Endometrial Ablation at this point.  Patient agrees with plan.  2. Right ovarian cyst Resolved right ovarian cyst.  Patient reassured.  Tarahji, it was a pleasure seeing you today!

## 2018-08-04 ENCOUNTER — Ambulatory Visit: Payer: BLUE CROSS/BLUE SHIELD | Admitting: Psychiatry

## 2018-08-11 ENCOUNTER — Encounter: Payer: Self-pay | Admitting: Psychiatry

## 2018-08-11 ENCOUNTER — Ambulatory Visit (INDEPENDENT_AMBULATORY_CARE_PROVIDER_SITE_OTHER): Payer: BLUE CROSS/BLUE SHIELD | Admitting: Psychiatry

## 2018-08-11 ENCOUNTER — Other Ambulatory Visit: Payer: Self-pay

## 2018-08-11 DIAGNOSIS — F4322 Adjustment disorder with anxiety: Secondary | ICD-10-CM

## 2018-08-11 NOTE — Progress Notes (Signed)
[facility] Counselor/Therapist Progress Note  Patient ID: Gina Bauer, MRN: [id number],    Date: 08/11/2018  Time Spent: 55 minutes   Treatment Type: Individual Therapy  Reported Symptoms: anxiety  Mental Status Exam:  Appearance:   Casual and Well Groomed     Behavior:  Appropriate  Motor:  Normal  Speech/Language:   Clear and Coherent  Affect:  Appropriate  Mood:  anxious  Thought process:  normal  Thought content:    WNL  Sensory/Perceptual disturbances:    WNL  Orientation:  oriented to person, place, time/date and situation  Attention:  Good  Concentration:  Good  Memory:  WNL  Fund of knowledge:   Good  Insight:    Good  Judgment:   Good  Impulse Control:  Good   Risk Assessment: Danger to Self:  No Self-injurious Behavior: No Danger to Others: No Duty to Warn:no Physical Aggression / Violence:No  Access to Firearms a concern: No  Gang Involvement:No   Subjective: Telehealth visit I connected with patient by a video enabled telemedicine/telehealth application or telephone, with her informed consent, and verified patient privacy and that I am speaking with the correct person using two identifiers.  I was located at my office and patient at her home.  We discussed the limitations, risks, and security and privacy concerns associated with telehealth services and the availability of in-person appointments, including awareness that she Gina Bauer be responsible for charges related to the service, and she expressed understanding and agreed to proceed.  I discussed treatment planning with her, with opportunity to ask and answer all questions. Agreed with the plan, demonstrated an understanding of the instructions, and made her aware to call our office if symptoms worsen or she feels she is in a crisis state and needs immediate contact.  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  She is currently taking her internship class and working on studying for her comprehensive exams. Client is nervous about her comprehensive exams.  We discussed her study schedule.  She plans on marking out specific periods of time to study for the 2 different parts of her comprehensive exams.  As she articulated this she noticed that her anxiety decreased.  She also reports that she has told none of her friends that she has been in school this past 2 years.  She is afraid that when they find out they will become upset with her or call her a liar for not telling them.  I discussed with the client that she was well within her rights and not to tell them.  She knew if she had it would have interfered with her interaction with them. The client also reports some anxiety about returning to her regular life when the lockdown is over.  She is worried about her family and the impact it Gina Bauer possibly have on them as far as exposure to the virus.  We discussed the fact that life would not be significantly different from what was going on before.  They just would not have as much time together as a family as they have.  The client realizes that she has enjoyed that time.  She is a little wistful about that.  Interventions: Assertiveness/Communication, Solution-Oriented/Positive Psychology and Insight-Oriented  Diagnosis:   ICD-10-CM   1. Adjustment disorder with anxiety F43.22  Plan: Assertiveness, steady schedule, boundaries with family and friends, exercise.  This record has been created using [employer].  Chart creation errors have been sought, but Gina Bauer not always have been located and corrected. Such creation errors do not reflect on the standard of medical care.  Gina Bauer, [facility]

## 2018-08-16 ENCOUNTER — Other Ambulatory Visit: Payer: Self-pay

## 2018-08-16 MED ORDER — NORGESTIMATE-ETH ESTRADIOL 0.25-35 MG-MCG PO TABS: 1.0000 | ORAL_TABLET | Freq: Every day | ORAL | 4 refills | Status: DC

## 2018-09-05 ENCOUNTER — Encounter: Payer: Self-pay | Admitting: Psychiatry

## 2018-09-05 ENCOUNTER — Ambulatory Visit (INDEPENDENT_AMBULATORY_CARE_PROVIDER_SITE_OTHER): Payer: BC Managed Care – PPO | Admitting: Psychiatry

## 2018-09-05 ENCOUNTER — Other Ambulatory Visit: Payer: Self-pay

## 2018-09-05 DIAGNOSIS — F4322 Adjustment disorder with anxiety: Secondary | ICD-10-CM | POA: Diagnosis not present

## 2018-09-05 NOTE — Progress Notes (Signed)
[facility] Counselor/Therapist Progress Note  Patient ID: Gina Bauer, MRN: [id number],    Date: 09/05/2018  Time Spent: 57 minutes   Treatment Type: Individual Therapy  Reported Symptoms: anxiety, stress  Mental Status Exam:  Appearance:   Casual and Well Groomed     Behavior:  Appropriate  Motor:  Normal  Speech/Language:   Clear and Coherent  Affect:  Appropriate  Mood:  anxious  Thought process:  normal  Thought content:    WNL  Sensory/Perceptual disturbances:    WNL  Orientation:  oriented to person, place, time/date and situation  Attention:  Good  Concentration:  Good  Memory:  WNL  Fund of knowledge:   Good  Insight:    Good  Judgment:   Good  Impulse Control:  Good   Risk Assessment: Danger to Self:  No Self-injurious Behavior: No Danger to Others: No Duty to Warn:no Physical Aggression / Violence:No  Access to Firearms a concern: No  Gang Involvement:No   Subjective: Telehealth visit (video) I connected with patient by a video enabled telemedicine/telehealth application or telephone, with her informed consent, and verified patient privacy and that I am speaking with the correct person using two identifiers.  I was located at my office and patient at her home.  We discussed the limitations, risks, and security and privacy concerns associated with telehealth services and the availability of in-person appointments, including awareness that she Gina Bauer be responsible for charges related to the service, and she expressed understanding and agreed to proceed.  I discussed treatment planning with her, with opportunity to ask and answer all questions. Agreed with the plan, demonstrated an understanding of the instructions, and made her aware to call our office if symptoms worsen or she feels she is in a crisis state and needs immediate contact.  The client's been very busy working from home.  She states her husband has gone up to [place] with her middle son to  settle his dad's estate.  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 The client has a lot of anxiety as she studies for her comprehensive exams at [facility].  She is fearful that she will fail and be seen as a fraud.  I used eye-movement with the client around this target.  She felt anxiety in her chest and her subjective units of distress was a 7.  As the client processed issues came up around a tooth that she has that needs to be recapped.  It Gina Bauer also need a root canal.  If the client sits for the comprehensive exam July 1 she is fearful she Gina Bauer be in pain from her dental work.  We discussed her assertively contacting a comprehensive exam people to see what fallback options she has.  As we continued eye-movement it became clear the client needed a stronger focus on controlling her thoughts.  She agrees to practice mindfulness as a way of doing this.  Her subjective units of distress at the end of the session was 3+.  Interventions: Mindfulness Meditation, Motivational Interviewing, Solution-Oriented/Positive Psychology, [employer] Group Desensitization and Reprocessing (EMDR) and Insight-Oriented  Diagnosis:   ICD-10-CM   1. Adjustment disorder with anxiety  F43.22     Plan: Mindfulness, self care, follow up with Dentist, assertiveness  This record has been created using [employer].  Chart creation errors have been sought, but Gina Bauer not always have been located and corrected. Such creation  errors do not reflect on the standard of medical care.Gina Bauer Gina Bauer, [facility]

## 2018-09-16 ENCOUNTER — Ambulatory Visit (INDEPENDENT_AMBULATORY_CARE_PROVIDER_SITE_OTHER): Payer: BC Managed Care – PPO | Admitting: Obstetrics & Gynecology

## 2018-09-16 ENCOUNTER — Other Ambulatory Visit: Payer: Self-pay

## 2018-09-16 ENCOUNTER — Encounter: Payer: Self-pay | Admitting: Obstetrics & Gynecology

## 2018-09-16 VITALS — BP 110/78 | Ht 62.0 in | Wt 129.0 lb

## 2018-09-16 DIAGNOSIS — N920 Excessive and frequent menstruation with regular cycle: Secondary | ICD-10-CM

## 2018-09-16 DIAGNOSIS — Z01419 Encounter for gynecological examination (general) (routine) without abnormal findings: Secondary | ICD-10-CM

## 2018-09-16 DIAGNOSIS — Z9189 Other specified personal risk factors, not elsewhere classified: Secondary | ICD-10-CM | POA: Diagnosis not present

## 2018-09-16 NOTE — Progress Notes (Signed)
Gina Bauer 07-Mar-1971 [id number]   History:    48 y.o. [id number] Married.  Vasectomy.  Finishing Psychotherapy degree.  Children doing well.  RP:  Established patient presenting for annual gyn exam   HPI: Much better on Sprintec with decreased menstrual flow.  Minor BTB.  No pelvic pain.  No pain with IC.  Normal secretions.  Urine/BMs normal.  Breasts normal.  BMI 23.59.  Good fitness.  Healthy nutrition.  Health labs with Fam MD.  Past medical history,surgical history, family history and social history were all reviewed and documented in the EPIC chart.  Gynecologic History Patient's last menstrual period was 09/15/2018. Contraception: OCP (estrogen/progesterone) and vasectomy Last Pap: 07/2016. Results were: Negative/HPV HR neg Last mammogram: 02/2018. Results were: Benign Bone Density: Never Colonoscopy: Never  Obstetric History OB History  Gravida Para Term Preterm AB Living  6 3       3   SAB TAB Ectopic Multiple Live Births               # Outcome Date GA Lbr Len/2nd Weight Sex Delivery Anes PTL Lv  6 Gravida           5 Gravida           4 Gravida           3 Para           2 Para           1 Para              ROS: A ROS was performed and pertinent positives and negatives are included in the history.  GENERAL: No fevers or chills. HEENT: No change in vision, no earache, sore throat or sinus congestion. NECK: No pain or stiffness. CARDIOVASCULAR: No chest pain or pressure. No palpitations. PULMONARY: No shortness of breath, cough or wheeze. GASTROINTESTINAL: No abdominal pain, nausea, vomiting or diarrhea, melena or bright red blood per rectum. GENITOURINARY: No urinary frequency, urgency, hesitancy or dysuria. MUSCULOSKELETAL: No joint or muscle pain, no back pain, no recent trauma. DERMATOLOGIC: No rash, no itching, no lesions. ENDOCRINE: No polyuria, polydipsia, no heat or cold intolerance. No recent change in weight. HEMATOLOGICAL: No anemia or easy bruising or  bleeding. NEUROLOGIC: No headache, seizures, numbness, tingling or weakness. PSYCHIATRIC: No depression, no loss of interest in normal activity or change in sleep pattern.     Exam:   BP 110/78   Ht 5\' 2"  (1.575 m)   Wt 129 lb (58.5 kg)   LMP 09/15/2018   BMI 23.59 kg/m   Body mass index is 23.59 kg/m.  General appearance : Well developed well nourished female. No acute distress HEENT: Eyes: no retinal hemorrhage or exudates,  Neck supple, trachea midline, no carotid bruits, no thyroidmegaly Lungs: Clear to auscultation, no rhonchi or wheezes, or rib retractions  Heart: Regular rate and rhythm, no murmurs or gallops Breast:Examined in sitting and supine position were symmetrical in appearance, no palpable masses or tenderness,  no skin retraction, no nipple inversion, no nipple discharge, no skin discoloration, no axillary or supraclavicular lymphadenopathy Abdomen: no palpable masses or tenderness, no rebound or guarding Extremities: no edema or skin discoloration or tenderness  Pelvic: Vulva: Normal             Vagina: No gross lesions or discharge  Cervix: No gross lesions or discharge.  Pap reflex done  Uterus  AV, normal size, shape and consistency, non-tender and mobile  Adnexa  Without masses  or tenderness  Anus: Normal   Assessment/Plan:  48 y.o. female for annual exam   1. Encounter for routine gynecological examination with Papanicolaou smear of cervix Normal gynecologic exam.  Pap reflex done.  Breast exam normal.  Last screening mammogram in December 2019 was benign.  Health labs with family physician.  Good body mass index at 23.59.  Continue with fitness and healthy nutrition.  2. Relies on partner vasectomy for contraception  3. Menorrhagia with regular cycle Decreased menstrual flow on birth control pills.  Birth control pills well-tolerated and no contraindication to continue.  Continuous use discussed.  Has a prescription until next year annual gynecologic  exam.   Gina Bruins MD, 9:29 AM 09/16/2018

## 2018-09-16 NOTE — Patient Instructions (Signed)
1. Encounter for routine gynecological examination with Papanicolaou smear of cervix Normal gynecologic exam.  Pap reflex done.  Breast exam normal.  Last screening mammogram in December 2019 was benign.  Health labs with family physician.  Good body mass index at 23.59.  Continue with fitness and healthy nutrition.  2. Relies on partner vasectomy for contraception  3. Menorrhagia with regular cycle Decreased menstrual flow on birth control pills.  Birth control pills well-tolerated and no contraindication to continue.  Continuous use discussed.  Has a prescription until next year annual gynecologic exam.  Gina Bauer, it was a pleasure seeing you today!  I will inform you of your results as soon as they are available.

## 2018-09-20 LAB — PAP IG W/ RFLX HPV ASCU

## 2018-10-04 ENCOUNTER — Telehealth: Payer: Self-pay | Admitting: *Deleted

## 2018-10-04 NOTE — Telephone Encounter (Signed)
Patient called to report heavier bleeding than normally on Ortho-cyclen, she was seen on 09/16/18 mentioned spotting, informed to skip placebo pills and just take active pills. Patient did do this for the 1st time and this am noticed heavier flow when using bathroom also clots. I explained to her this could just be related to skipping placebo's for the first time and to watch for now and report if bleeding become heavier pad/tampon every 1hour or more increased clots. patient verbalized she understood, I told her I would run by you to confirm you agree with plan as well.

## 2018-10-04 NOTE — Telephone Encounter (Signed)
Agree with your instructions. 

## 2018-10-05 NOTE — Telephone Encounter (Signed)
Left on pt voicemail Dr.Lavoie was okay with this.

## 2018-10-10 ENCOUNTER — Other Ambulatory Visit: Payer: Self-pay

## 2018-10-10 ENCOUNTER — Ambulatory Visit (INDEPENDENT_AMBULATORY_CARE_PROVIDER_SITE_OTHER): Payer: BC Managed Care – PPO | Admitting: Psychiatry

## 2018-10-10 ENCOUNTER — Encounter: Payer: Self-pay | Admitting: Psychiatry

## 2018-10-10 DIAGNOSIS — F4322 Adjustment disorder with anxiety: Secondary | ICD-10-CM

## 2018-10-10 NOTE — Progress Notes (Signed)
      [facility] Counselor/Therapist Progress Note  Patient ID: URIYAH MASSIMO, MRN: [id number],    Date: 10/10/2018  Time Spent: 55 minutes   Treatment Type: Individual Therapy  Reported Symptoms: anxiety  Mental Status Exam:  Appearance:   Casual     Behavior:  Appropriate  Motor:  Normal  Speech/Language:   Clear and Coherent  Affect:  Appropriate  Mood:  anxious  Thought process:  normal  Thought content:    WNL  Sensory/Perceptual disturbances:    WNL  Orientation:  oriented to person, place, time/date and situation  Attention:  Good  Concentration:  Good  Memory:  WNL  Fund of knowledge:   Good  Insight:    Good  Judgment:   Good  Impulse Control:  Good   Risk Assessment: Danger to Self:  No Self-injurious Behavior: No Danger to Others: No Duty to Warn:no Physical Aggression / Violence:No  Access to Firearms a concern: No  Gang Involvement:No   Subjective: Telehealth visit -- I connected with this patient by an approved telecommunication method (video), with her informed consent, and verifying identity and patient privacy.  I was located at my office and patient at her home.  As needed, we discussed the limitations, risks, and security and privacy concerns associated with telehealth service, including the availability and conditions which currently govern in-person appointments and the possibility that 3rd-party payment Roxie Gueye not be fully guaranteed and she Tekelia Kareem be responsible for charges.  After she indicated understanding, we proceeded with the session.  Also discussed treatment planning, as needed, including ongoing verbal agreement with the plan, the opportunity to ask and answer all questions, her demonstrated understanding of instructions, and her readiness to call the office should symptoms worsen or she feels she is in a crisis state and needs more immediate and tangible assistance. 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  We used eye-movement today through the WebEx meeting focusing on her anxiety around this exam.  Her negative cognition is, "I am going to fail."  She feels fear in her chest or subjective units of distress started at a 9+.  We were able to get it down to a 4 by the end of the session. The client has a lot of anticipatory anxiety.  In her life in general she finds that people are demanding of her.  This puts a lot of pressure on her.  At the same time it makes her angry.  We discussed what the proper boundaries should be especially with other people's children.  We also discussed more assertive behavior and setting some boundaries upfront before she gets into the middle of things.  We also discussed the fact that she can say no. Her positive cognition at the end of the session was, "I am going to past my exam."    Interventions: Assertiveness/Communication, Motivational Interviewing, Solution-Oriented/Positive Psychology and Insight-Oriented  Diagnosis:   ICD-10-CM   1. Adjustment disorder with anxiety  F43.22     Plan: Positive Self Talk, boundaries, assertiveness, self-care.  This record has been created using [employer].  Chart creation errors have been sought, but Vanesa Renier not always have been located and corrected. Such creation errors do not reflect on the standard of medical care.   Alayzia Pavlock, [facility]

## 2018-10-26 ENCOUNTER — Ambulatory Visit (INDEPENDENT_AMBULATORY_CARE_PROVIDER_SITE_OTHER): Payer: BC Managed Care – PPO | Admitting: Psychiatry

## 2018-10-26 ENCOUNTER — Other Ambulatory Visit: Payer: Self-pay

## 2018-10-26 ENCOUNTER — Encounter: Payer: Self-pay | Admitting: Psychiatry

## 2018-10-26 DIAGNOSIS — F4322 Adjustment disorder with anxiety: Secondary | ICD-10-CM
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
## 2018-11-16 ENCOUNTER — Encounter: Payer: Self-pay | Admitting: Psychiatry

## 2018-11-16 ENCOUNTER — Ambulatory Visit (INDEPENDENT_AMBULATORY_CARE_PROVIDER_SITE_OTHER): Payer: BC Managed Care – PPO | Admitting: Psychiatry

## 2018-11-16 ENCOUNTER — Other Ambulatory Visit: Payer: Self-pay

## 2018-11-16 DIAGNOSIS — F4322 Adjustment disorder with anxiety: Secondary | ICD-10-CM | POA: Diagnosis not present

## 2018-11-16 NOTE — Progress Notes (Signed)
      [facility] Counselor/Therapist Progress Note  Patient ID: SAHNI TORDOFF, MRN: [id number],    Date: 11/16/2018  Time Spent: 58 minutes   Treatment Type: Individual Therapy  Reported Symptoms: anxious  Mental Status Exam:  Appearance:   Casual     Behavior:  Appropriate  Motor:  Normal  Speech/Language:   Clear and Coherent  Affect:  Appropriate  Mood:  anxious  Thought process:  normal  Thought content:    WNL  Sensory/Perceptual disturbances:    WNL  Orientation:  oriented to person, place, time/date and situation  Attention:  Good  Concentration:  Good  Memory:  WNL  Fund of knowledge:   Good  Insight:    Good  Judgment:   Good  Impulse Control:  Good   Risk Assessment: Danger to Self:  No Self-injurious Behavior: No Danger to Others: No Duty to Warn:no Physical Aggression / Violence:No  Access to Firearms a concern: No  Gang Involvement:No   Subjective: The client is stressed out.  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  "I need 600 hours with direct class time, supervision and client face-to-face time."  The client is unsure that she is going to be able to get enough hours before the internship is over in December.  She discussed difficulties she was having with studying for the national exam as well as other comprehensive exams required by the [facility]. The client was overwhelmed with the thoughts of not passing her comprehensive's.  She recently passed the main one with a grade well above what was expected.  I pointed out to the client how much she has prepared and studied.  I used eye-movement with the client to reduce her anxiety from the subjective units of distress of 5 to less than 1 at the end of the session.  She was more confident of being able to complete the things that she need to do.  She also agreed that even if it took to March to get her hours for her internship she would still graduate.   Her positive cognition at the end of the session was, "I can do this."  Interventions: Motivational Interviewing, Solution-Oriented/Positive Psychology and Insight-Oriented  Diagnosis:   ICD-10-CM   1. Adjustment disorder with anxiety  F43.22     Plan: Positive self talk, assertiveness, self-care, exercise.  Owyn Raulston, [facility]

## 2018-12-05 ENCOUNTER — Encounter: Payer: Self-pay | Admitting: Psychiatry

## 2018-12-05 ENCOUNTER — Ambulatory Visit (INDEPENDENT_AMBULATORY_CARE_PROVIDER_SITE_OTHER): Payer: BC Managed Care – PPO | Admitting: Psychiatry

## 2018-12-05 ENCOUNTER — Other Ambulatory Visit: Payer: Self-pay

## 2018-12-05 DIAGNOSIS — F4322 Adjustment disorder with anxiety: Secondary | ICD-10-CM | POA: Diagnosis not present

## 2018-12-05 NOTE — Progress Notes (Signed)
      [facility] Counselor/Therapist Progress Note  Patient ID: Gina Bauer, MRN: [id number],    Date: 12/05/2018  Time Spent: 56 minutes  Treatment Type: Individual Therapy  Reported Symptoms: Anxiety  Mental Status Exam:  Appearance:   Casual     Behavior:  Appropriate  Motor:  Normal  Speech/Language:   Clear and Coherent  Affect:  Appropriate  Mood:  anxious  Thought process:  normal  Thought content:    WNL  Sensory/Perceptual disturbances:    WNL  Orientation:  oriented to person, place, time/date and situation  Attention:  Good  Concentration:  Good  Memory:  WNL  Fund of knowledge:   Good  Insight:    Good  Judgment:   Good  Impulse Control:  Good   Risk Assessment: Danger to Self:  No Self-injurious Behavior: No Danger to Others: No Duty to Warn:no Physical Aggression / Violence:No  Access to Firearms a concern: No  Gang Involvement:No   Subjective: The client states that she is suffering from COVID fatigue.  She is worn out trying to keep up with all of her friends different responses to the [place] pandemic. 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  It takes place October 14, 1 month from today.  I used eye-movement with the client focusing on the exam.  Her negative cognition is, "I will fail."  She feels anxiety in her chest and her subjective units of distress is a 7.  As the client processed she stated, "I do not want to be a fraud."  This means that she is afraid she will pass the exam and all her work in her masters program will be for nothing.  She realizes that she sees herself through a distorted lens.  "I do not see myself correctly."  Her expectation that she will fail comes out of her childhood.  As she gained insight into this we continued with eye-movement and her subjective units of distress went to 4.  She agrees that she will "do the best he can" which was her positive cognition at the end of the  session.  Interventions: Assertiveness/Communication, Motivational Interviewing, Solution-Oriented/Positive Psychology, [employer] Group Desensitization and Reprocessing (EMDR) and Insight-Oriented  Diagnosis:   ICD-10-CM   1. Adjustment disorder with anxiety  F43.22     Plan: Positive self talk, assertiveness, boundaries, self-care, exercise.  Fintan Grater, [facility]

## 2018-12-19 ENCOUNTER — Ambulatory Visit (INDEPENDENT_AMBULATORY_CARE_PROVIDER_SITE_OTHER): Payer: BC Managed Care – PPO | Admitting: Psychiatry

## 2018-12-19 ENCOUNTER — Encounter: Payer: Self-pay | Admitting: Psychiatry

## 2018-12-19 ENCOUNTER — Other Ambulatory Visit: Payer: Self-pay

## 2018-12-19 DIAGNOSIS — F4322 Adjustment disorder with anxiety: Secondary | ICD-10-CM | POA: Diagnosis not present

## 2018-12-19 NOTE — Progress Notes (Signed)
      [facility] Counselor/Therapist Progress Note  Patient ID: KARIE BRIDGMAN, MRN: [id number],    Date: 12/19/2018  Time Spent: 54 minutes   Treatment Type: Individual Therapy  Reported Symptoms: anxious  Mental Status Exam:  Appearance:   Casual     Behavior:  Appropriate  Motor:  Normal  Speech/Language:   Clear and Coherent  Affect:  Appropriate  Mood:  anxious  Thought process:  normal  Thought content:    WNL  Sensory/Perceptual disturbances:    WNL  Orientation:  oriented to person, place, time/date and situation  Attention:  Good  Concentration:  Good  Memory:  WNL  Fund of knowledge:   Good  Insight:    Good  Judgment:   Good  Impulse Control:  Good   Risk Assessment: Danger to Self:  No Self-injurious Behavior: No Danger to Others: No Duty to Warn:no Physical Aggression / Violence:No  Access to Firearms a concern: No  Gang Involvement:No   Subjective: The client reports that she pulled her back.  She also has a concurrent sinus infection which she is on antibiotics for.  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  This is been the exam the client has been studying for months.  She is fearful that she will not be able to take the exam. I used eye-movement with the client around her fear of not being able to take the exam.  Her subjective units of distress was a 7.  She felt anxiety in her chest and her back.  As the client processed she realized that her thoughts were catastrophic.  We discussed the necessity for her to care for herself and not study 24/7.  The client agreed but feels until the exam is over she really cannot relax. The client stated, "I am desperate to close the book on the exam."  We discussed her using exercise as a way to help reduce her stress.  I believe her back pain comes from muscle tension resulting from overthinking her exam.  The  client will try some home remedies as well as a hot bath to see if she can relax those muscles.  I encouraged her to take a little time off from studying to relax.  She is uncertain if she can do that.  Asked the client to do mindfulness exercises to help bring her mind to the present tense so that she reduces the amount of catastrophic thinking she does.  Interventions: Mindfulness Meditation, Motivational Interviewing, Solution-Oriented/Positive Psychology, [employer] Group Desensitization and Reprocessing (EMDR) and Insight-Oriented  Diagnosis:   ICD-10-CM   1. Adjustment disorder with anxiety  F43.22     Plan: Mindfulness, self-care, positive self talk, boundaries.  Jaidan Stachnik, [facility]

## 2019-01-04 ENCOUNTER — Other Ambulatory Visit: Payer: Self-pay

## 2019-01-04 ENCOUNTER — Encounter: Payer: Self-pay | Admitting: Psychiatry

## 2019-01-04 ENCOUNTER — Ambulatory Visit (INDEPENDENT_AMBULATORY_CARE_PROVIDER_SITE_OTHER): Payer: BC Managed Care – PPO | Admitting: Psychiatry

## 2019-01-04 DIAGNOSIS — F4322 Adjustment disorder with anxiety: Secondary | ICD-10-CM | POA: Diagnosis not present

## 2019-01-04 NOTE — Progress Notes (Signed)
[facility] Counselor/Therapist Progress Note  Patient ID: Gina Bauer, MRN: [id number],    Date: 01/04/2019  Time Spent: 61 minutes   Treatment Type: Individual Therapy  Reported Symptoms: anxiety  Mental Status Exam:  Appearance:   Casual     Behavior:  Appropriate  Motor:  Normal  Speech/Language:   Clear and Coherent  Affect:  Appropriate  Mood:  anxious  Thought process:  normal  Thought content:    WNL  Sensory/Perceptual disturbances:    WNL  Orientation:  oriented to person, place, time/date and situation  Attention:  Good  Concentration:  Good  Memory:  WNL  Fund of knowledge:   Good  Insight:    Good  Judgment:   Good  Impulse Control:  Good   Risk Assessment: Danger to Self:  No Self-injurious Behavior: No Danger to Others: No Duty to Warn:no Physical Aggression / Violence:No  Access to Firearms a concern: No  Gang Involvement:No   Subjective: The client had followed up with a chiropractor about her back.  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  She states she recently received an email where they are offering the exam at a facility or taking it online.  Since the client has issues with wearing a mask for any length of time she opted to take it online.  She stated they scanned her computer and will require that her WebCam be on so they can monitor her during the exam.  They have a lot of requirements which she feels she can easily meet. Today we used eye-movement to focus on the exam the client will be taking.  Her negative cognition is, "I feel powerless.  I feel so stuck."  She feels anxiety in her stomach and chest.  Her subjective units of distress is an 8+.  As the client processed she thought about people online that have been very negative about taking the NCE.  People talking about how hard it is and how they flunk the test by one point.  She stated she finally had to get off the website because she  could not take the negative talk.  I asked the client as we continued processing if she could trust her judgment.  Her tendency is to move immediately to catastrophic thinking such as "I will fail."  I pointed out to the client that she graduated from college with a good GPA and now has 2 masters degrees.  She clearly can do the work.  I asked her "if you keep thinking you are afraid you will fail, will you keep on studying hard?"  The client said yes she would.  I challenged her saying, "what if you just accepted this is a difficult exam and you are prepared as best you can be?"  You will still study but not be as stressed.  The client agreed.  As we continued the eye-movement she was able to make the shift in her subjective units of distress was at 4.  I asked the client what she could tell herself as she went into the exam that would keep her in a calm place.  "The Rich Fuchs is with me."  This was her positive cognition that we integrated at the end of the session.  Interventions: Mindfulness Meditation, Motivational Interviewing, Solution-Oriented/Positive Psychology, CIT Group Desensitization and Reprocessing (EMDR) and Insight-Oriented  Diagnosis:   ICD-10-CM   1. Adjustment disorder with anxiety  F43.22     Plan: Mindfulness, positive self talk, self-care, radical acceptance.  Dylin Ihnen, [facility]

## 2019-01-23 ENCOUNTER — Other Ambulatory Visit: Payer: Self-pay

## 2019-01-23 ENCOUNTER — Ambulatory Visit (INDEPENDENT_AMBULATORY_CARE_PROVIDER_SITE_OTHER): Payer: BC Managed Care – PPO | Admitting: Psychiatry

## 2019-01-23 ENCOUNTER — Encounter: Payer: Self-pay | Admitting: Psychiatry

## 2019-01-23 DIAGNOSIS — F4322 Adjustment disorder with anxiety: Secondary | ICD-10-CM | POA: Diagnosis not present

## 2019-01-23 NOTE — Progress Notes (Signed)
      [facility] Counselor/Therapist Progress Note  Patient ID: Gina Bauer, MRN: [id number],    Date: 01/23/2019  Time Spent: 57 minutes   Treatment Type: Individual Therapy  Reported Symptoms: 57 minutes  Mental Status Exam:  Appearance:   Casual     Behavior:  Appropriate  Motor:  Normal  Speech/Language:   Clear and Coherent  Affect:  Appropriate  Mood:  anxious  Thought process:  normal  Thought content:    WNL  Sensory/Perceptual disturbances:    WNL  Orientation:  oriented to person, place, time/date and situation  Attention:  Good  Concentration:  Good  Memory:  WNL  Fund of knowledge:   Good  Insight:    Good  Judgment:   Good  Impulse Control:  Good   Risk Assessment: Danger to Self:  No Self-injurious Behavior: No Danger to Others: No Duty to Warn:no Physical Aggression / Violence:No  Access to Firearms a concern: No  Gang Involvement:No   Subjective: 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  "I have studied a lot and I am still worried."  I used eye-movement with the client focusing on the NCE exam.  Her negative cognition is, "I will fail."  She feels anxiety in her chest.  Her subjective units of distress is an 8.  As the client processed, she discussed how much studying she has done.  She has 2 apps on her phone that she has gone through all the test questions 2 and 3 times.  She has the Aubery Lapping book which is basically the primary for the NCE exam.  She has gone through this 3 times.  I pointed out to the client that thousands of counselors will be taking this exam.  It is not designed for them all to fail.  The client agreed.  She stated she really needed to have faith and believe that she would pass.  We also discussed using mindfulness as a way to stay in the present tense.  As she processed this her subjective units of distress dropped to less than 3.  Her positive cognition at the end of the session was, "I will  pass."  The client also is planning on purchasing a therapy dog.  Interventions: Assertiveness/Communication, Mindfulness Meditation, Motivational Interviewing, Solution-Oriented/Positive Psychology, CIT Group Desensitization and Reprocessing (EMDR) and Insight-Oriented  Diagnosis:   ICD-10-CM   1. Adjustment disorder with anxiety  F43.22     Plan: Mindfulness, assertiveness, boundaries, self-care, positive self talk, prayer.  Deboraha Goar, [facility]

## 2019-02-06 ENCOUNTER — Ambulatory Visit: Payer: BC Managed Care – PPO | Admitting: Psychiatry

## 2019-02-28 ENCOUNTER — Ambulatory Visit (INDEPENDENT_AMBULATORY_CARE_PROVIDER_SITE_OTHER): Payer: BC Managed Care – PPO | Admitting: Psychiatry

## 2019-02-28 ENCOUNTER — Encounter: Payer: Self-pay | Admitting: Psychiatry

## 2019-02-28 ENCOUNTER — Other Ambulatory Visit: Payer: Self-pay

## 2019-02-28 DIAGNOSIS — F4322 Adjustment disorder with anxiety: Secondary | ICD-10-CM | POA: Diagnosis not present

## 2019-02-28 NOTE — Progress Notes (Signed)
      [facility] Counselor/Therapist Progress Note  Patient ID: ZOEJANE CZECHOWSKI, MRN: [id number],    Date: 02/28/2019  Time Spent: 56 minutes   Treatment Type: Individual Therapy  Reported Symptoms: anxious  Mental Status Exam:  Appearance:   Well Groomed     Behavior:  Appropriate  Motor:  Normal  Speech/Language:   Clear and Coherent  Affect:  Appropriate  Mood:  anxious  Thought process:  normal  Thought content:    WNL  Sensory/Perceptual disturbances:    WNL  Orientation:  oriented to person, place, time/date and situation  Attention:  Good  Concentration:  Good  Memory:  WNL  Fund of knowledge:   Good  Insight:    Good  Judgment:   Good  Impulse Control:  Good   Risk Assessment: Danger to Self:  No Self-injurious Behavior: No Danger to Others: No Duty to Warn:no Physical Aggression / Violence:No  Access to Firearms a concern: No  Gang Involvement:No   Subjective: 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  "I am relieved."  She and her family recently got a new dog, a goldendoodle, potentially as a therapy dog.  She has assigned her sons to care for the dog for the time being which is working out well. Today the client's main issue is with her oldest son.  He has not consistently taking his Prozac and as a result his behavior and mood has been more erratic.  If he does not get his way he explodes and then acts passive aggressively.  One main issue that they had conflict over was around some cousins that live in [place] and [place] coming over to the house on the weekend.  The clients concern was the increase in the COVID-19 pandemic numbers.  Her husband is overweight and at higher risk and she wants to avoid being exposed.  Her son was unwilling or unable to understand this.  He continued to badger her. I discussed with the client that she needs to avoid these kinds of power struggles.  I used eye-movement with the client focusing on  her son.  Her negative cognition is, "you need to do what I say.  (Son).  Her subjective units of distress is a 5.  She feels frustration in her chest.  As the client processed she realized that she and her husband need to set consistent boundaries with her son.  I agreed and suggested they sit down together as a threesome and clearly articulate what the boundaries are.  He does not have the same say as they do his parents.  She agreed.  The client will discuss this with her husband and try to implement this.  Her subjective units of distress was less than 3 at the end of the session.  Interventions: Assertiveness/Communication, Motivational Interviewing, Solution-Oriented/Positive Psychology, CIT Group Desensitization and Reprocessing (EMDR) and Insight-Oriented  Diagnosis:   ICD-10-CM   1. Adjustment disorder with anxiety  F43.22     Plan: Boundaries with son, assertiveness, meeting with husband and son, exercise, self care.  Johnelle Tafolla, [facility]

## 2019-03-27 ENCOUNTER — Telehealth: Payer: Self-pay | Admitting: *Deleted

## 2019-03-27 MED ORDER — NORGESTIMATE-ETH ESTRADIOL 0.25-35 MG-MCG PO TABS: ORAL_TABLET | ORAL | 0 refills | Status: DC

## 2019-03-27 MED ORDER — NORGESTIMATE-ETH ESTRADIOL 0.25-35 MG-MCG PO TABS: ORAL_TABLET | ORAL | 1 refills | Status: DC

## 2019-03-27 NOTE — Telephone Encounter (Signed)
Patient called back stating [facility] will not have her Rx ready in the mail for her, needs Rx now. Asked if Rx could be sent to local pharmacy. 1 pack of pills sent.

## 2019-03-27 NOTE — Addendum Note (Signed)
Addended by: Thamas Jaegers on: 03/27/2019 10:37 AM   Modules accepted: Orders

## 2019-03-27 NOTE — Telephone Encounter (Signed)
Patient called report she was told to skip the placebo pills of every pack of pills so no cycle. Patient husband has vasectomy. Per note on 09/16/18 "Continuous use discussed" Rx sent with directions sent to skip placebo pills.

## 2019-04-03 ENCOUNTER — Other Ambulatory Visit: Payer: Self-pay

## 2019-04-03 ENCOUNTER — Ambulatory Visit (INDEPENDENT_AMBULATORY_CARE_PROVIDER_SITE_OTHER): Payer: BC Managed Care – PPO | Admitting: Psychiatry

## 2019-04-03 ENCOUNTER — Encounter: Payer: Self-pay | Admitting: Psychiatry

## 2019-04-03 DIAGNOSIS — F4322 Adjustment disorder with anxiety: Secondary | ICD-10-CM | POA: Diagnosis not present

## 2019-04-03 NOTE — Progress Notes (Signed)
[facility] Counselor/Therapist Progress Note  Patient ID: Gina Bauer, MRN: [id number],    Date: 04/03/2019  Time Spent: 50 minutes   Treatment Type: Individual Therapy  Reported Symptoms: anxious  Mental Status Exam:  Appearance:   Well Groomed     Behavior:  Appropriate  Motor:  Normal  Speech/Language:   Clear and Coherent  Affect:  Appropriate  Mood:  anxious  Thought process:  normal  Thought content:    WNL  Sensory/Perceptual disturbances:    WNL  Orientation:  oriented to person, place, time/date and situation  Attention:  Good  Concentration:  Good  Memory:  WNL  Fund of knowledge:   Good  Insight:    Good  Judgment:   Good  Impulse Control:  Good   Risk Assessment: Danger to Self:  No Self-injurious Behavior: No Danger to Others: No Duty to Warn:no Physical Aggression / Violence:No  Access to Firearms a concern: No  Gang Involvement:No   Subjective: The client reports that she found out her score on the [employer] Exam.  She scored in the top 3% of people taking the exam nationally.  I pointed out to the client that her negative beliefs before taking the exam was that she would fail.  The client agreed that she needs to adjust her expectations.  Client also states that she is very nervous about the continued COVID-19 pandemic.  She has friends who's extended family have gotten COVID-40 and had terrible outcomes.  She is trying to avoid any exposure for her family. The Christmas holidays were good for the client.  She was disappointed she did not get to see her friends because of COVID-19.  She discussed today that she has this anticipatory anxiety that is the subjective units of distress of 6+.  We used eye-movement today to focus on this.  As the client processed, I pointed out that she was possibly giving COVID-19 too much power?  She agreed that she might be.  I explained that if she used universal precautions such as handwashing and not  touching her face along with appropriate mask use she is doing the best she can.  The client agreed.  Her anxiety still seemed to want to hold on.  She felt very strongly in her chest.  She felt that the anxiety was fighting with her rational thought process.  I asked her, "where it is that come from?"  She stated she got that from her mother.  I pointed out that things are very different for her than they were for her mother.  The client agreed.  As she continued with the eye-movement I asked her to think about turning it over to the care of God.  That is what her mother would have wanted.  The client agreed.  As she did so her subjective units of distress became less than 2.  Her positive cognition was, "I do not have to fear."  The client remembered to biblical versus that supported that, Vonna Kotyk 1:9 and Isaiah 41:10.  She will review those to encourage her self and mindfully pray about them.  Interventions: Assertiveness/Communication, Mindfulness Meditation, Motivational Interviewing, Eye Movement Desensitization and Reprocessing (EMDR) and Insight-Oriented  Diagnosis:   ICD-10-CM   1. Adjustment disorder with anxiety  F43.22     Plan: Mindful prayer, review of scripture, positive self talk, mood independent behavior, self-care, exercise.  Rayni Nemitz, [facility]

## 2019-04-17 ENCOUNTER — Other Ambulatory Visit: Payer: Self-pay

## 2019-04-17 ENCOUNTER — Encounter: Payer: Self-pay | Admitting: Psychiatry

## 2019-04-17 ENCOUNTER — Ambulatory Visit (INDEPENDENT_AMBULATORY_CARE_PROVIDER_SITE_OTHER): Payer: BC Managed Care – PPO | Admitting: Psychiatry

## 2019-04-17 DIAGNOSIS — F4322 Adjustment disorder with anxiety: Secondary | ICD-10-CM | POA: Diagnosis not present

## 2019-04-17 NOTE — Progress Notes (Signed)
      [facility] Counselor/Therapist Progress Note  Patient ID: Gina Bauer, MRN: [id number],    Date: 04/17/2019  Time Spent: 50 minutes   Treatment Type: Individual Therapy  Reported Symptoms: anxious  Mental Status Exam:  Appearance:   Well Groomed     Behavior:  Appropriate  Motor:  Normal  Speech/Language:   Clear and Coherent  Affect:  Appropriate  Mood:  anxious  Thought process:  normal  Thought content:    WNL  Sensory/Perceptual disturbances:    WNL  Orientation:  oriented to person, place, time/date and situation  Attention:  Good  Concentration:  Good  Memory:  WNL  Fund of knowledge:   Good  Insight:    Good  Judgment:   Good  Impulse Control:  Good   Risk Assessment: Danger to Self:  No Self-injurious Behavior: No Danger to Others: No Duty to Warn:no Physical Aggression / Violence:No  Access to Firearms a concern: No  Gang Involvement:No   Subjective: The client has had a number of stressors which has contributed to her level of anxiety.  One of her mentors at work died unexpectedly after recently being diagnosed with a tumor on his lung from stage IV lung cancer.  A friend's 50 year old son unexpectedly committed suicide.  Another close friend disclosed her daughter as a 4 inch tumor on her lung.  The client is worried about this young woman getting COVID-19 if she goes to the hospital.  "There is so much going on!"  The client also feels powerless over her son's who are traveling here and there.  They are trying to prevent them from being exposed to COVID-19 and she does not want them to be responsible for spreading COVID-19.  "I am stressed to the max."  We used eye-movement focusing on this.  Her subjective units of distress equals 7.  She feels anxious and upset in her chest.  As the client processed we discussed how so many of these things were outside of her control.  She agreed.  I suggested that she would have to turn this over to the care of God to  the best of her ability.  We discussed the use of radical acceptance in the circumstances.  Client realizes that she Gina Bauer not have any other choice.  Her subjective units of distress at the end of the session was less than 3.  Interventions: Motivational Interviewing, Solution-Oriented/Positive Psychology, CIT Group Desensitization and Reprocessing (EMDR) and Insight-Oriented  Diagnosis:   ICD-10-CM   1. Adjustment disorder with anxiety  F43.22     Plan: Positive self talk, radical acceptance, boundaries, self-care, exercise.  Jhordan Mckibben, [facility]

## 2019-05-08 ENCOUNTER — Encounter: Payer: Self-pay | Admitting: Psychiatry

## 2019-05-08 ENCOUNTER — Other Ambulatory Visit: Payer: Self-pay

## 2019-05-08 ENCOUNTER — Ambulatory Visit (INDEPENDENT_AMBULATORY_CARE_PROVIDER_SITE_OTHER): Payer: BC Managed Care – PPO | Admitting: Psychiatry

## 2019-05-08 DIAGNOSIS — F4322 Adjustment disorder with anxiety: Secondary | ICD-10-CM | POA: Diagnosis not present

## 2019-05-08 NOTE — Progress Notes (Signed)
      [facility] Counselor/Therapist Progress Note  Patient ID: Gina Bauer, MRN: [id number],    Date: 05/08/2019  Time Spent: 50 minutes   Treatment Type: Individual Therapy  Reported Symptoms: anxious  Mental Status Exam:  Appearance:   Casual     Behavior:  Appropriate  Motor:  Normal  Speech/Language:   Clear and Coherent  Affect:  Appropriate  Mood:  anxious  Thought process:  normal  Thought content:    WNL  Sensory/Perceptual disturbances:    WNL  Orientation:  oriented to person, place, time/date and situation  Attention:  Good  Concentration:  Good  Memory:  WNL  Fund of knowledge:   Good  Insight:    Good  Judgment:   Good  Impulse Control:  Good   Risk Assessment: Danger to Self:  No Self-injurious Behavior: No Danger to Others: No Duty to Warn:no Physical Aggression / Violence:No  Access to Firearms a concern: No  Gang Involvement:No   Subjective: Client states that she is 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  She has been struggling with her oldest son not taking his medication.  When she talks to him she states, "I feel ignored and disrespected."  This also has happened with one of the dad's on the soccer team.  The client is the team parent that disseminate's the information from the athletic director and coaches to the parents.  She states this one dad went over her head directly to the coach for information.  Today we used eye-movement focusing on these events.  Her negative cognition is, "I am disrespected.  I feel stupid."  She feels frazzled in her chest.  Her subjective units of distress is a 6.  As the client processed she connected that this is the same kind of behavior that her dad would do with her and her mom.  He would ignore their wishes and then do what he wants.  I discussed with the client that this other parent has his own issues that have nothing to do with her.  We discussed how she allows others to define  who she is.  She agreed.  We continued eye-movement around this and were able to reduce her level of disturbance to less than 3.  The client agrees that she still needs to work on this and will pay more attention to these interactions going forward.  I encouraged her to make sure she sets clear boundaries with logical consequences when necessary.  Interventions: Assertiveness/Communication, Motivational Interviewing, Solution-Oriented/Positive Psychology, CIT Group Desensitization and Reprocessing (EMDR) and Insight-Oriented  Diagnosis:   ICD-10-CM   1. Adjustment disorder with anxiety  F43.22     Plan: Mood independent behavior, positive self talk, boundaries, assertiveness, self-care.  Yassmine Tamm, [facility]

## 2019-05-22 ENCOUNTER — Encounter: Payer: Self-pay | Admitting: Psychiatry

## 2019-05-22 ENCOUNTER — Ambulatory Visit (INDEPENDENT_AMBULATORY_CARE_PROVIDER_SITE_OTHER): Payer: BC Managed Care – PPO | Admitting: Psychiatry

## 2019-05-22 ENCOUNTER — Other Ambulatory Visit: Payer: Self-pay

## 2019-05-22 DIAGNOSIS — F4322 Adjustment disorder with anxiety: Secondary | ICD-10-CM

## 2019-05-22 NOTE — Progress Notes (Signed)
      [facility] Counselor/Therapist Progress Note  Patient ID: TIARRA KIOUS, MRN: [id number],    Date: 05/22/2019  Time Spent: 50 minutes   Treatment Type: Individual Therapy  Reported Symptoms: anxiety  Mental Status Exam:  Appearance:   Casual     Behavior:  Appropriate  Motor:  Normal  Speech/Language:   Clear and Coherent  Affect:  Appropriate  Mood:  anxious  Thought process:  normal  Thought content:    WNL  Sensory/Perceptual disturbances:    WNL  Orientation:  oriented to person, place, time/date and situation  Attention:  Good  Concentration:  Good  Memory:  WNL  Fund of knowledge:   Good  Insight:    Good  Judgment:   Good  Impulse Control:  Good   Risk Assessment: Danger to Self:  No Self-injurious Behavior: No Danger to Others: No Duty to Warn:no Physical Aggression / Violence:No  Access to Firearms a concern: No  Gang Involvement:No   Subjective: The client is struggling with her oldest son.  He is a Museum/gallery exhibitions officer in college but living at home.  He refuses to take his Prozac and she can tell that it is having a negative effect.  Recently, late one evening she received texts from his ex-girlfriend.  She was forwarding the text he was sending her.  All of the texts indicated that he could not move on with life the way it was and implied he would commit suicide.  This upset the client significantly.  I used the bilateral stimulation hand paddles with the client focusing on these events.  As we discussed it she stated that she and her husband had a conversation with him about what they read.  He became upset stating that he had cornered him.  He stated that he was not suicidal and would never do that.  I pointed out to the client that most likely her son said that to his ex-girlfriend for dramatic effect.  She agreed but it still bothers her significantly. Her husband was able to resolve the issue.  But she states her son is still being difficult.  He is in school  full-time and focused on his future.  He works out every day and is rushing a Hydrologist at school.  He is very busy.  I suggested that her son has too much of an investment in himself to commit suicide.  She agreed.  As she continued to process her subjective units of distress went from a 7 to less than 1. 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  This is moving towards one of her long-term goals.  She is very excited about this.  Interventions: Assertiveness/Communication, Motivational Interviewing, Solution-Oriented/Positive Psychology, CIT Group Desensitization and Reprocessing (EMDR) and Insight-Oriented  Diagnosis:   ICD-10-CM   1. Adjustment disorder with anxiety  F43.22     Plan: Mood independent behavior, positive self talk, self-care, assertiveness, boundaries.  Sharrie Self, [facility]

## 2019-06-12 ENCOUNTER — Ambulatory Visit: Payer: BC Managed Care – PPO | Admitting: Psychiatry

## 2019-06-26 ENCOUNTER — Other Ambulatory Visit: Payer: Self-pay

## 2019-06-26 ENCOUNTER — Encounter: Payer: Self-pay | Admitting: Psychiatry

## 2019-06-26 ENCOUNTER — Ambulatory Visit (INDEPENDENT_AMBULATORY_CARE_PROVIDER_SITE_OTHER): Payer: BC Managed Care – PPO | Admitting: Psychiatry

## 2019-06-26 DIAGNOSIS — F4322 Adjustment disorder with anxiety: Secondary | ICD-10-CM | POA: Diagnosis not present

## 2019-06-26 NOTE — Progress Notes (Signed)
      [facility] Counselor/Therapist Progress Note  Patient ID: Gina Bauer, MRN: [id number],    Date: 06/26/2019  Time Spent: 50 minutes   Treatment Type: Individual Therapy  Reported Symptoms: anxiety  Mental Status Exam:  Appearance:   Casual     Behavior:  Appropriate  Motor:  Normal  Speech/Language:   Clear and Coherent  Affect:  Appropriate  Mood:  anxious  Thought process:  normal  Thought content:    WNL  Sensory/Perceptual disturbances:    WNL  Orientation:  oriented to person, place, time/date and situation  Attention:  Good  Concentration:  Good  Memory:  WNL  Fund of knowledge:   Good  Insight:    Good  Judgment:   Good  Impulse Control:  Good   Risk Assessment: Danger to Self:  No Self-injurious Behavior: No Danger to Others: No Duty to Warn:no Physical Aggression / Violence:No  Access to Firearms a concern: No  Gang Involvement:No   Subjective: The client states that her biggest issue has been with her oldest son.  He is 19 and currently has switched medications from Prozac to Wellbutrin.  Over the weekend the client's son became angry with her because he has taken psychiatric medication since he was in seventh grade.  Even though the medications he is taken have helped him tremendously his attitude was that his mother, the client, had forced it upon him.  This caused the client's anxiety to go very high.  She took on the responsibility and then began to blame herself.  I pointed out to the client that her son had done very well and most recently the switch in medications was even more positive.  The client acknowledges that her sons behavior tends to cycle like this every so often where he becomes fixated and obsessed over something that plays itself out by the next morning.  Today I used eye-movement with the client focusing on her son.  Her negative cognition was, "it is my fault."  She feels a subjective units of distress of 6.  As she continued to  process she realized that her over responsibility bordered on codependence.  I asked the client if she could anticipate the meltdown behavior, then she could detach.  The client agreed that she would try this.  I explained that she would need to use mood independent behavior.  She agreed.  Her subjective units of distress was less than 2 at the end of the session.  Interventions: Assertiveness/Communication, Motivational Interviewing, Solution-Oriented/Positive Psychology, CIT Group Desensitization and Reprocessing (EMDR) and Insight-Oriented  Diagnosis:   ICD-10-CM   1. Adjustment disorder with anxiety  F43.22     Plan: Mood independent behavior, assertiveness, boundaries, self-care, positive self talk.  Lasheka Kempner, [facility]

## 2019-07-10 ENCOUNTER — Other Ambulatory Visit: Payer: Self-pay

## 2019-07-10 ENCOUNTER — Ambulatory Visit (INDEPENDENT_AMBULATORY_CARE_PROVIDER_SITE_OTHER): Payer: BC Managed Care – PPO | Admitting: Psychiatry

## 2019-07-10 ENCOUNTER — Encounter: Payer: Self-pay | Admitting: Psychiatry

## 2019-07-10 DIAGNOSIS — F4322 Adjustment disorder with anxiety: Secondary | ICD-10-CM | POA: Diagnosis not present

## 2019-07-10 NOTE — Progress Notes (Signed)
      [facility] Counselor/Therapist Progress Note  Patient ID: Gina Bauer, MRN: [id number],    Date: 07/10/2019  Time Spent: 45 minutes   Treatment Type: Individual Therapy  Reported Symptoms: anxious  Mental Status Exam:  Appearance:   Casual     Behavior:  Appropriate  Motor:  Normal  Speech/Language:   Clear and Coherent  Affect:  Appropriate  Mood:  anxious  Thought process:  normal  Thought content:    WNL  Sensory/Perceptual disturbances:    WNL  Orientation:  oriented to person, place, time/date and situation  Attention:  Good  Concentration:  Good  Memory:  WNL  Fund of knowledge:   Good  Insight:    Good  Judgment:   Good  Impulse Control:  Good   Risk Assessment: Danger to Self:  No Self-injurious Behavior: No Danger to Others: No Duty to Warn:no Physical Aggression / Violence:No  Access to Firearms a concern: No  Gang Involvement:No   Subjective: The client is winding down her semester and preparing to graduate [employer].  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  She wants to start practicing. The client also discussed her relationship with a friend she has not seen because of COVID.  She is reached out to her a few times by text and not really gotten a response to spending time together.  Today the client articulates that it must be her fault she must have done something wrong.  I challenged that with the client by asking her what that she do wrong she states, "I do not know."  I explained to the client that this is an example of a thinking error called mind-reading.  I gave the client a handout of the most common thinking errors at people engage in.  I then used eye-movement with the client to help her let go of her concerns.  As the client processed she stated, I always go back to that."  I explained that she would have to use mood independent behavior mindfully to resist the impulse to try  to fix the situation.  She agreed.  As we continued to process she wondered why others did not organize and engage people to make things happen?  I pointed out that this was her skill set and her gift.  The client was surprised but realized that this was true.  I used eye-movement focusing on this belief.  The client feels she has a better grasp of it.  Interventions: Assertiveness/Communication, Mindfulness Meditation, Motivational Interviewing, Solution-Oriented/Positive Psychology, [employer] Group Desensitization and Reprocessing (EMDR) and Insight-Oriented  Diagnosis:   ICD-10-CM   1. Adjustment disorder with anxiety  F43.22     Plan: Review thinking errors handout, mindfulness, mood independent behavior, self-care, positive self talk, assertiveness, boundaries.  Gina Bauer, [facility]

## 2019-07-31 ENCOUNTER — Ambulatory Visit (INDEPENDENT_AMBULATORY_CARE_PROVIDER_SITE_OTHER): Payer: BC Managed Care – PPO | Admitting: Psychiatry

## 2019-07-31 ENCOUNTER — Other Ambulatory Visit: Payer: Self-pay

## 2019-07-31 ENCOUNTER — Encounter: Payer: Self-pay | Admitting: Psychiatry

## 2019-07-31 DIAGNOSIS — F4322 Adjustment disorder with anxiety: Secondary | ICD-10-CM

## 2019-07-31 NOTE — Progress Notes (Signed)
      [facility] Counselor/Therapist Progress Note  Patient ID: Gina Bauer, MRN: [id number],    Date: 07/31/2019  Time Spent: 50 minutes   Treatment Type: Individual Therapy  Reported Symptoms: anxiety  Mental Status Exam:  Appearance:   Well Groomed     Behavior:  Appropriate  Motor:  Normal  Speech/Language:   Clear and Coherent  Affect:  Appropriate  Mood:  anxious  Thought process:  normal  Thought content:    WNL  Sensory/Perceptual disturbances:    WNL  Orientation:  oriented to person, place, time/date and situation  Attention:  Good  Concentration:  Good  Memory:  WNL  Fund of knowledge:   Good  Insight:    Good  Judgment:   Good  Impulse Control:  Good   Risk Assessment: Danger to Self:  No Self-injurious Behavior: No Danger to Others: No Duty to Warn:no Physical Aggression / Violence:No  Access to Firearms a concern: No  Gang Involvement:No   Subjective: "Mother's Day is not always great."  The client still grieves the loss of her own mother and has difficulty letting others care for her.  She states that she graduates [employer] and her job at the [employer] ends for the summer.  "I do not do well with not having a plan."  The client then began to speak negatively about maybe people should not come to her graduation and not to make a big deal about it.  I pointed out to the client that this is something she would not let her own children say.  She agreed.  I used eye-movement with the client as well as the bilateral stimulation hand paddles.  We focused on her negative beliefs of unworth.  As the client processed she saw that she did not need to let go of this.  When she gets anxious she tends to regress back to her negative self image. I asked the client to focus on allowing others to care for her as well as caring for herself.  We discussed having radical acceptance that others feel better if they do care for her and she needs to let that happen.  She  also will need to practice more mood independent behavior around this.  The client agreed.  Her subjective units of distress went from a 5+ to less than 3.  Interventions: Assertiveness/Communication, Motivational Interviewing, Solution-Oriented/Positive Psychology, [employer] Group Desensitization and Reprocessing (EMDR) and Insight-Oriented  Diagnosis:   ICD-10-CM   1. Adjustment disorder with anxiety  F43.22     Plan: mood independent behavior, radical acceptance, self-care, positive self talk, assertiveness, boundaries.  Gina Bauer, [facility]

## 2019-08-14 ENCOUNTER — Encounter: Payer: Self-pay | Admitting: Psychiatry

## 2019-08-14 ENCOUNTER — Other Ambulatory Visit: Payer: Self-pay

## 2019-08-14 ENCOUNTER — Ambulatory Visit (INDEPENDENT_AMBULATORY_CARE_PROVIDER_SITE_OTHER): Payer: BC Managed Care – PPO | Admitting: Psychiatry

## 2019-08-14 DIAGNOSIS — F4322 Adjustment disorder with anxiety: Secondary | ICD-10-CM | POA: Diagnosis not present

## 2019-08-14 NOTE — Progress Notes (Signed)
      [facility] Counselor/Therapist Progress Note  Patient ID: Gina Bauer, MRN: [id number],    Date: 08/14/2019  Time Spent: 45 minutes   Treatment Type: Individual Therapy  Reported Symptoms: anxious  Mental Status Exam:  Appearance:   Well Groomed     Behavior:  Appropriate  Motor:  Normal  Speech/Language:   Clear and Coherent  Affect:  Appropriate  Mood:  anxious  Thought process:  normal  Thought content:    WNL  Sensory/Perceptual disturbances:    WNL  Orientation:  oriented to person, place, time/date and situation  Attention:  Good  Concentration:  Good  Memory:  WNL  Fund of knowledge:   Good  Insight:    Good  Judgment:   Good  Impulse Control:  Good   Risk Assessment: Danger to Self:  No Self-injurious Behavior: No Danger to Others: No Duty to Warn:no Physical Aggression / Violence:No  Access to Firearms a concern: No  Gang Involvement:No   Subjective: Client states that her anxiety is up.  She has finished school and is waiting to receive her paperwork so she can file for her license.  Her husband has been offered a new position within [employer].  He has had to stay quiet about it until it is announced .  Because of that he is stressed out.  Her children are finishing up school and are anxious because of the end of grade testing and final exams.  I discussed with the client what she can do to take care of herself to reduce her anxiety?  She has been walking each day which has been positive.  She also has started reading books which has been helpful as well.  I asked the client to look at other hobbies she can take up since so much of her time was always focused on school.  The client Christia Domke contact the [facility] about a painting class. I discussed with the client ways of keeping her thinking on track.  Radical acceptance has helped her especially with her family and circumstances.  She is practicing mood independent behavior and focusing on the present  tense.  Interventions: Assertiveness/Communication, Mindfulness Meditation, Motivational Interviewing, Solution-Oriented/Positive Psychology and Insight-Oriented  Diagnosis:   ICD-10-CM   1. Adjustment disorder with anxiety  F43.22     Plan: Mood independent behavior, self-care, positive self talk, engaged activities, social network, exercise, radical acceptance.  Caitlan Chauca, [facility]

## 2019-08-28 ENCOUNTER — Ambulatory Visit: Payer: BC Managed Care – PPO | Admitting: Psychiatry

## 2019-09-05 ENCOUNTER — Other Ambulatory Visit: Payer: Self-pay | Admitting: Obstetrics & Gynecology

## 2019-09-05 ENCOUNTER — Other Ambulatory Visit: Payer: Self-pay

## 2019-09-05 MED ORDER — NORGESTIMATE-ETH ESTRADIOL 0.25-35 MG-MCG PO TABS: ORAL_TABLET | ORAL | 0 refills | Status: DC

## 2019-09-12 ENCOUNTER — Ambulatory Visit (INDEPENDENT_AMBULATORY_CARE_PROVIDER_SITE_OTHER): Payer: BC Managed Care – PPO | Admitting: Psychiatry

## 2019-09-12 ENCOUNTER — Other Ambulatory Visit: Payer: Self-pay

## 2019-09-12 DIAGNOSIS — F4322 Adjustment disorder with anxiety: Secondary | ICD-10-CM
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
## 2019-09-13 ENCOUNTER — Encounter: Payer: Self-pay | Admitting: Psychiatry

## 2019-09-15 ENCOUNTER — Other Ambulatory Visit: Payer: Self-pay

## 2019-09-18 ENCOUNTER — Other Ambulatory Visit: Payer: Self-pay

## 2019-09-18 ENCOUNTER — Ambulatory Visit (INDEPENDENT_AMBULATORY_CARE_PROVIDER_SITE_OTHER): Payer: BC Managed Care – PPO | Admitting: Obstetrics & Gynecology

## 2019-09-18 ENCOUNTER — Encounter: Payer: Self-pay | Admitting: Obstetrics & Gynecology

## 2019-09-18 VITALS — BP 110/68 | Ht 62.0 in | Wt 128.0 lb

## 2019-09-18 DIAGNOSIS — Z9189 Other specified personal risk factors, not elsewhere classified: Secondary | ICD-10-CM

## 2019-09-18 DIAGNOSIS — N920 Excessive and frequent menstruation with regular cycle: Secondary | ICD-10-CM

## 2019-09-18 DIAGNOSIS — Z01419 Encounter for gynecological examination (general) (routine) without abnormal findings: Secondary | ICD-10-CM | POA: Diagnosis not present

## 2019-09-18 MED ORDER — NORGESTIMATE-ETH ESTRADIOL 0.25-35 MG-MCG PO TABS: ORAL_TABLET | ORAL | 4 refills | Status: DC

## 2019-09-18 NOTE — Progress Notes (Signed)
Gina Bauer 12-14-1970 [id number]   History:    49 y.o. [id number] Married.  Vasectomy.  Finished with Psychotherapy degree.  Children doing well.  RP:  Established patient presenting for annual gyn exam   HPI: Much better on Sprintec with decreased menstrual flow.  No BTB.  No pelvic pain.  No pain with IC.  Normal secretions.  Urine/BMs normal.  Breasts normal.  BMI 23.41.  Good fitness.  Healthy nutrition.  Health labs with Fam MD.   Past medical history,surgical history, family history and social history were all reviewed and documented in the EPIC chart.  Gynecologic History Patient's last menstrual period was 07/24/2019.  Obstetric History OB History  Gravida Para Term Preterm AB Living  6 3       3   SAB TAB Ectopic Multiple Live Births               # Outcome Date GA Lbr Len/2nd Weight Sex Delivery Anes PTL Lv  6 Gravida           5 Gravida           4 Gravida           3 Para           2 Para           1 Para              ROS: A ROS was performed and pertinent positives and negatives are included in the history.  GENERAL: No fevers or chills. HEENT: No change in vision, no earache, sore throat or sinus congestion. NECK: No pain or stiffness. CARDIOVASCULAR: No chest pain or pressure. No palpitations. PULMONARY: No shortness of breath, cough or wheeze. GASTROINTESTINAL: No abdominal pain, nausea, vomiting or diarrhea, melena or bright red blood per rectum. GENITOURINARY: No urinary frequency, urgency, hesitancy or dysuria. MUSCULOSKELETAL: No joint or muscle pain, no back pain, no recent trauma. DERMATOLOGIC: No rash, no itching, no lesions. ENDOCRINE: No polyuria, polydipsia, no heat or cold intolerance. No recent change in weight. HEMATOLOGICAL: No anemia or easy bruising or bleeding. NEUROLOGIC: No headache, seizures, numbness, tingling or weakness. PSYCHIATRIC: No depression, no loss of interest in normal activity or change in sleep pattern.     Exam:   BP  110/68   Ht 5\' 2"  (1.575 m)   Wt 128 lb (58.1 kg)   LMP 07/24/2019   BMI 23.41 kg/m   Body mass index is 23.41 kg/m.  General appearance : Well developed well nourished female. No acute distress HEENT: Eyes: no retinal hemorrhage or exudates,  Neck supple, trachea midline, no carotid bruits, no thyroidmegaly Lungs: Clear to auscultation, no rhonchi or wheezes, or rib retractions  Heart: Regular rate and rhythm, no murmurs or gallops Breast:Examined in sitting and supine position were symmetrical in appearance, no palpable masses or tenderness,  no skin retraction, no nipple inversion, no nipple discharge, no skin discoloration, no axillary or supraclavicular lymphadenopathy Abdomen: no palpable masses or tenderness, no rebound or guarding Extremities: no edema or skin discoloration or tenderness  Pelvic: Vulva: Normal             Vagina: No gross lesions or discharge  Cervix: Normal. No gross lesions or discharge  Uterus  AV, normal size, shape and consistency, non-tender and mobile  Adnexa  Without masses or tenderness  Anus: Normal   Assessment/Plan:  49 y.o. female for annual exam   1. Well female exam with routine gynecological exam  Normal gynecologic exam.  No indication to repeat a Pap test this year.  Breast exam normal.  Screening mammogram December 2020 was negative.  Health labs with family physician.  Good body mass index at 23.41.  Continue with fitness and healthy nutrition.  2. Relies on partner vasectomy for contraception  3. Menorrhagia with regular cycle Menorrhagia well controlled on Sprintec.  No contraindication to continue.  Prescription sent to pharmacy.  Other orders - norgestimate-ethinyl estradiol (ORTHO-CYCLEN) 0.25-35 MG-MCG tablet; Take one active pill daily, skipping placebo pill.  [name] MD, 8:13 AM 09/18/2019

## 2019-09-22 ENCOUNTER — Encounter: Payer: Self-pay | Admitting: Obstetrics & Gynecology

## 2019-09-22 NOTE — Patient Instructions (Signed)
1. Well female exam with routine gynecological exam Normal gynecologic exam.  No indication to repeat a Pap test this year.  Breast exam normal.  Screening mammogram December 2020 was negative.  Health labs with family physician.  Good body mass index at 23.41.  Continue with fitness and healthy nutrition.  2. Relies on partner vasectomy for contraception  3. Menorrhagia with regular cycle Menorrhagia well controlled on Sprintec.  No contraindication to continue.  Prescription sent to pharmacy.  Other orders - norgestimate-ethinyl estradiol (ORTHO-CYCLEN) 0.25-35 MG-MCG tablet; Take one active pill daily, skipping placebo pill.  Markitta, it was a pleasure seeing you today!

## 2019-09-26 ENCOUNTER — Other Ambulatory Visit: Payer: Self-pay

## 2019-09-26 ENCOUNTER — Encounter: Payer: Self-pay | Admitting: Psychiatry

## 2019-09-26 ENCOUNTER — Ambulatory Visit (INDEPENDENT_AMBULATORY_CARE_PROVIDER_SITE_OTHER): Payer: BC Managed Care – PPO | Admitting: Psychiatry

## 2019-09-26 DIAGNOSIS — F4322 Adjustment disorder with anxiety: Secondary | ICD-10-CM | POA: Diagnosis not present

## 2019-09-26 NOTE — Progress Notes (Signed)
      [facility] Counselor/Therapist Progress Note  Patient ID: Gina Bauer, MRN: [id number],    Date: 09/26/2019  Time Spent: 50 minutes   Treatment Type: Individual Therapy  Reported Symptoms: anxiety  Mental Status Exam:  Appearance:   Casual     Behavior:  Appropriate  Motor:  Normal  Speech/Language:   Clear and Coherent  Affect:  Appropriate  Mood:  anxious  Thought process:  normal  Thought content:    WNL  Sensory/Perceptual disturbances:    WNL  Orientation:  oriented to person, place, time/date and situation  Attention:  Good  Concentration:  Good  Memory:  WNL  Fund of knowledge:   Good  Insight:    Good  Judgment:   Good  Impulse Control:  Good   Risk Assessment: Danger to Self:  No Self-injurious Behavior: No Danger to Others: No Duty to Warn:no Physical Aggression / Violence:No  Access to Firearms a concern: No  Gang Involvement:No   Subjective: The client states that after 4 July is sad for her.  She knows that she will have to go back to work soon and that she will not be able to spend the time at the pool with her sons.  Her 2 older sons are life guards and her youngest son is on swim team.  She states it is bittersweet for her seeing them grow up.  The client is an only child and holds her family closely.  We discussed the fact that this process of her children maturing and leaving home is very normal.  I asked the client to be more mindful to focus on what she has now versus worrying about the future.  The client agrees. She states that her middle son has been having difficulty.  A mother is trying to get him into dating her daughter and dumping his girlfriend.  The client has intervened to help her son set better boundaries and not be guilted into doing things.  Her oldest son recently had a friend that he is known since he was 8 die in a tragic car accident.  The client states that the funeral this past week was chaotic with all the kids involved.  The  client states it affected her deeply as well seeing a family who had lost a child.  We discussed again the client focusing on the present tense and her family.  She agreed.  Interventions: Assertiveness/Communication, Mindfulness Meditation, Motivational Interviewing, Solution-Oriented/Positive Psychology and Insight-Oriented  Diagnosis:   ICD-10-CM   1. Adjustment disorder with anxiety  F43.22     Plan: Mindfulness, mood independent behavior, self-care, positive self talk, assertiveness, boundaries.  Ludwika Rodd, [facility]

## 2019-10-10 ENCOUNTER — Encounter: Payer: Self-pay | Admitting: Psychiatry

## 2019-10-10 ENCOUNTER — Ambulatory Visit (INDEPENDENT_AMBULATORY_CARE_PROVIDER_SITE_OTHER): Payer: BC Managed Care – PPO | Admitting: Psychiatry

## 2019-10-10 DIAGNOSIS — F4322 Adjustment disorder with anxiety: Secondary | ICD-10-CM | POA: Diagnosis not present
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
## 2019-10-16 ENCOUNTER — Telehealth: Payer: Self-pay | Admitting: *Deleted

## 2019-10-16 NOTE — Telephone Encounter (Signed)
Patient left detailed message on voicemail stating she left her birth control pills at home and she is out of town and asked if 1 pack could be sent to a Management consultant. I called and left detailed message on cell to call me back with pharmacy name and # to send Rx.

## 2019-10-31 ENCOUNTER — Encounter: Payer: Self-pay | Admitting: Psychiatry

## 2019-10-31 ENCOUNTER — Ambulatory Visit (INDEPENDENT_AMBULATORY_CARE_PROVIDER_SITE_OTHER): Payer: BC Managed Care – PPO | Admitting: Psychiatry

## 2019-10-31 ENCOUNTER — Other Ambulatory Visit: Payer: Self-pay

## 2019-10-31 DIAGNOSIS — F4322 Adjustment disorder with anxiety: Secondary | ICD-10-CM

## 2019-10-31 NOTE — Progress Notes (Signed)
°      [facility] Counselor/Therapist Progress Note  Patient ID: ALAINAH PHANG, MRN: [id number],    Date: 10/31/2019  Time Spent: 56 minutes   Treatment Type: Individual Therapy  Reported Symptoms: anxiety  Mental Status Exam:  Appearance:   Casual     Behavior:  Appropriate  Motor:  Normal  Speech/Language:   Clear and Coherent  Affect:  Appropriate  Mood:  anxious  Thought process:  normal  Thought content:    WNL  Sensory/Perceptual disturbances:    WNL  Orientation:  oriented to person, place, time/date and situation  Attention:  Good  Concentration:  Good  Memory:  WNL  Fund of knowledge:   Good  Insight:    Good  Judgment:   Good  Impulse Control:  Good   Risk Assessment: Danger to Self:  No Self-injurious Behavior: No Danger to Others: No Duty to Warn:no Physical Aggression / Violence:No  Access to Firearms a concern: No  Gang Involvement:No   Subjective: The client has just returned from her family vacation at [place], [place].  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  She had double checked with the licensing board and found that all her information was in except for one letter of reference.  She had contacted the person she had asked for the referral but believes she Magon Croson be on vacation.  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  She feels uncertainty that she Vernadine Coombs lose her skills before she can start.  I pointed out to the to the client that this was quite an irrational thought.  She agreed.  This was something that she would benefit from having radical acceptance about.  I used eye-movement with the client focusing on this issue.  Her negative cognition was, "I am a joke."  She feels uncertainty and anxiety in her chest.  Her subjective units of distress is a 6.  As the client processed, other things came  up that were also causing her anxiety.  Her husband's blood sugar has been spiking.  He does not want to listen to her advice about diet.  Her oldest son has recently moved out of the house and into a fraternity house on the [place].  As the client discussed these different issues I pointed out that she was using her counseling skills in all of these different circumstances.  The client agreed that this was true.  She also was going to help her son improve his performance in soccer by using a variation of the EMDR process.  As she continued to process her subjective units of distress was less than 3.  Her positive cognition was, "I use my skills all the time."  She agreed that she was not losing her skills.  Interventions: Assertiveness/Communication, Motivational Interviewing, Solution-Oriented/Positive Psychology, [employer] Group Desensitization and Reprocessing (EMDR) and Insight-Oriented  Diagnosis:   ICD-10-CM   1. Adjustment disorder with anxiety  F43.22     Plan: Positive self talk, self-care, boundaries, assertiveness, exercise, radical acceptance.  Lesley Atkin, [facility]

## 2019-11-21 ENCOUNTER — Encounter: Payer: Self-pay | Admitting: Psychiatry

## 2019-11-21 ENCOUNTER — Ambulatory Visit (INDEPENDENT_AMBULATORY_CARE_PROVIDER_SITE_OTHER): Payer: BC Managed Care – PPO | Admitting: Psychiatry

## 2019-11-21 ENCOUNTER — Other Ambulatory Visit: Payer: Self-pay

## 2019-11-21 DIAGNOSIS — F4322 Adjustment disorder with anxiety: Secondary | ICD-10-CM

## 2019-11-21 NOTE — Progress Notes (Signed)
      [facility] Counselor/Therapist Progress Note  Patient ID: Gina Bauer, MRN: [id number],    Date: 11/21/2019  Time Spent: 50 minutes   Treatment Type: Individual Therapy  Reported Symptoms: anxious  Mental Status Exam:  Appearance:   Well Groomed     Behavior:  Appropriate  Motor:  Normal  Speech/Language:   Clear and Coherent  Affect:  Appropriate  Mood:  anxious  Thought process:  normal  Thought content:    WNL  Sensory/Perceptual disturbances:    WNL  Orientation:  oriented to person, place, time/date and situation  Attention:  Good  Concentration:  Good  Memory:  WNL  Fund of knowledge:   Good  Insight:    Good  Judgment:   Good  Impulse Control:  Good   Risk Assessment: Danger to Self:  No Self-injurious Behavior: No Danger to Others: No Duty to Warn:no Physical Aggression / Violence:No  Access to Firearms a concern: No  Gang Involvement:No   Subjective: The client states that she is 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  She is experiencing a lot of stress in the meantime.  She is having school issues with her youngest son.  The carpool line is long so the client typically walks from her house to school with her son.  When she went into pick up her son, the person at the front desk was harried and apparently other parents were trying to avoid the carpool line which upset this person.  She told the client she was "cheating" by trying to get her son without being in [place].  She was very upset about this and had written the principal a letter which has not been responded to. Her son also got ringworm and impetigo.  She is also worried about her youngest son contracting COVID-19 at school.  Her middle son is having trouble on his high school soccer team.  The coach has not been playing him and the reasons are unclear.  This frustrates the client since her son plays club soccer at a high level.  Today I used  eye-movement with the client focusing on these issues.  Her negative cognition was, "I cannot do anything."  She feels stress in her chest.  It is a subjective units of distress of 7+.  As the client processed through she kept discussing the fixes that she would apply to each of these different problems.  I pointed out to the client that she is always solving the problems.  She agreed.  Her positive cognition at the end of the session was, "I always figure it out."  Her subjective units of distress was less than 2.  I reminded the client that she needed to practice more radical acceptance and the circumstances that she has no control over.  Interventions: Assertiveness/Communication, Motivational Interviewing, Solution-Oriented/Positive Psychology, [employer] Group Desensitization and Reprocessing (EMDR) and Insight-Oriented  Diagnosis:   ICD-10-CM   1. Adjustment disorder with anxiety  F43.22     Plan: Mood independent behavior, assertiveness, boundaries, self-care, exercise, positive self talk, radical acceptance.  Chivon Lepage, [facility]

## 2019-11-28 ENCOUNTER — Other Ambulatory Visit: Payer: Self-pay | Admitting: Obstetrics & Gynecology

## 2019-11-28 MED ORDER — NORGESTIMATE-ETH ESTRADIOL 0.25-35 MG-MCG PO TABS: ORAL_TABLET | ORAL | 3 refills | Status: DC

## 2019-12-07 ENCOUNTER — Other Ambulatory Visit (HOSPITAL_BASED_OUTPATIENT_CLINIC_OR_DEPARTMENT_OTHER): Payer: Self-pay | Admitting: Family Medicine

## 2019-12-07 ENCOUNTER — Ambulatory Visit (INDEPENDENT_AMBULATORY_CARE_PROVIDER_SITE_OTHER): Payer: BC Managed Care – PPO

## 2019-12-07 ENCOUNTER — Other Ambulatory Visit: Payer: Self-pay | Admitting: Family Medicine

## 2019-12-07 ENCOUNTER — Other Ambulatory Visit: Payer: Self-pay

## 2019-12-07 DIAGNOSIS — R1011 Right upper quadrant pain: Secondary | ICD-10-CM

## 2019-12-14 ENCOUNTER — Ambulatory Visit: Payer: BC Managed Care – PPO | Admitting: Psychiatry

## 2019-12-20 ENCOUNTER — Other Ambulatory Visit: Payer: Self-pay

## 2019-12-20 ENCOUNTER — Ambulatory Visit (INDEPENDENT_AMBULATORY_CARE_PROVIDER_SITE_OTHER): Payer: BC Managed Care – PPO | Admitting: Psychiatry

## 2019-12-20 ENCOUNTER — Encounter: Payer: Self-pay | Admitting: Psychiatry

## 2019-12-20 DIAGNOSIS — F4322 Adjustment disorder with anxiety: Secondary | ICD-10-CM

## 2019-12-20 NOTE — Progress Notes (Signed)
°      [facility] Counselor/Therapist Progress Note  Patient ID: Gina Bauer, MRN: [id number],    Date: 12/20/2019  Time Spent: 67 minnutes   Treatment Type: Individual Therapy  Reported Symptoms: anxiety  Mental Status Exam:  Appearance:   Well Groomed     Behavior:  Appropriate  Motor:  Normal  Speech/Language:   Clear and Coherent  Affect:  Appropriate  Mood:  anxious  Thought process:  normal  Thought content:    WNL  Sensory/Perceptual disturbances:    WNL  Orientation:  oriented to person, place, time/date and situation  Attention:  Good  Concentration:  Good  Memory:  WNL  Fund of knowledge:   Good  Insight:    Good  Judgment:   Good  Impulse Control:  Good   Risk Assessment: Danger to Self:  No Self-injurious Behavior: No Danger to Others: No Duty to Warn:no Physical Aggression / Violence:No  Access to Firearms a concern: No  Gang Involvement:No   Subjective: The client has a pain in her side that the physicians have not been able to determine what the problem is.  She is going for a series of tests at the end of the week using radioactive dye.  She has some anxiety related to this. The client's husband was competing for a major promotion at his work.  It came down to two candidates.  The other candidate won.  The client stated when her husband came home he did not tell her until late in the evening.  She fell asleep.  The client states her husband interpreted that as "you do not love me.  You do not care about me.  I am just a doormat."  This is the same dynamic that they have gotten into periodically over the years.  The client stated she bought into it and went to her negative place.  I challenged the client to change her behavior and make a mood independent decision to act differently.  I suggested to the client that she write her husband a letter affirming her love, comforting him in his loss and suggesting that they handle the circumstances differently in the  future.  Then provide a template.  The client felt like she could do this.  I used eye-movement with the client helping her reduce her level of anxiety from the subjective units of distress of 5 to less than 2.  Interventions: Assertiveness/Communication, Motivational Interviewing, Solution-Oriented/Positive Psychology, [employer] Group Desensitization and Reprocessing (EMDR) and Insight-Oriented  Diagnosis:   ICD-10-CM   1. Adjustment disorder with anxiety  F43.22     Plan: Mood independent behavior, positive self talk, assertiveness, boundaries, letter to husband.  Maille Halliwell, [facility]

## 2019-12-25 ENCOUNTER — Emergency Department (HOSPITAL_COMMUNITY): Payer: BC Managed Care – PPO

## 2019-12-25 ENCOUNTER — Other Ambulatory Visit: Payer: Self-pay

## 2019-12-25 ENCOUNTER — Encounter (HOSPITAL_COMMUNITY): Payer: Self-pay

## 2019-12-25 ENCOUNTER — Emergency Department (HOSPITAL_COMMUNITY)
Admission: EM | Admit: 2019-12-25 | Discharge: 2019-12-25 | Disposition: A | Discharge status: Home / Self Care | Payer: BC Managed Care – PPO | Attending: Emergency Medicine | Admitting: Emergency Medicine

## 2019-12-25 DIAGNOSIS — R109 Unspecified abdominal pain: Secondary | ICD-10-CM

## 2019-12-25 DIAGNOSIS — D259 Leiomyoma of uterus, unspecified: Secondary | ICD-10-CM | POA: Insufficient documentation

## 2019-12-25 DIAGNOSIS — R319 Hematuria, unspecified: Secondary | ICD-10-CM | POA: Insufficient documentation

## 2019-12-25 DIAGNOSIS — R1031 Right lower quadrant pain: Secondary | ICD-10-CM | POA: Diagnosis present

## 2019-12-25 LAB — CBC WITH DIFFERENTIAL/PLATELET
Abs Immature Granulocytes: 0.01 10*3/uL (ref 0.00–0.07)
Basophils Absolute: 0 10*3/uL (ref 0.0–0.1)
Basophils Relative: 1 %
Eosinophils Absolute: 0.1 10*3/uL (ref 0.0–0.5)
Eosinophils Relative: 1 %
HCT: 36 % (ref 36.0–46.0)
Hemoglobin: 12 g/dL (ref 12.0–15.0)
Immature Granulocytes: 0 %
Lymphocytes Relative: 19 %
Lymphs Abs: 1.5 10*3/uL (ref 0.7–4.0)
MCH: 30.2 pg (ref 26.0–34.0)
MCHC: 33.3 g/dL (ref 30.0–36.0)
MCV: 90.5 fL (ref 80.0–100.0)
Monocytes Absolute: 0.7 10*3/uL (ref 0.1–1.0)
Monocytes Relative: 9 %
Neutro Abs: 5.6 10*3/uL (ref 1.7–7.7)
Neutrophils Relative %: 70 %
Platelets: 251 10*3/uL (ref 150–400)
RBC: 3.98 MIL/uL (ref 3.87–5.11)
RDW: 14.6 % (ref 11.5–15.5)
WBC: 8 10*3/uL (ref 4.0–10.5)
nRBC: 0 % (ref 0.0–0.2)

## 2019-12-25 LAB — URINALYSIS, ROUTINE W REFLEX MICROSCOPIC
Bacteria, UA: NONE SEEN
Bilirubin Urine: NEGATIVE
Glucose, UA: NEGATIVE mg/dL
Ketones, ur: NEGATIVE mg/dL
Leukocytes,Ua: NEGATIVE
Nitrite: NEGATIVE
Protein, ur: NEGATIVE mg/dL
Specific Gravity, Urine: 1.009 (ref 1.005–1.030)
pH: 6 (ref 5.0–8.0)

## 2019-12-25 LAB — COMPREHENSIVE METABOLIC PANEL
ALT: 18 U/L (ref 0–44)
AST: 19 U/L (ref 15–41)
Albumin: 3.6 g/dL (ref 3.5–5.0)
Alkaline Phosphatase: 37 U/L — ABNORMAL LOW (ref 38–126)
Anion gap: 11 (ref 5–15)
BUN: 8 mg/dL (ref 6–20)
CO2: 24 mmol/L (ref 22–32)
Calcium: 9.3 mg/dL (ref 8.9–10.3)
Chloride: 106 mmol/L (ref 98–111)
Creatinine, Ser: 0.62 mg/dL (ref 0.44–1.00)
GFR calc Af Amer: >60 mL/min (ref >60)
GFR calc non Af Amer: >60 mL/min (ref >60)
Glucose, Bld: 88 mg/dL (ref 70–99)
Potassium: 3.4 mmol/L — ABNORMAL LOW (ref 3.5–5.1)
Sodium: 141 mmol/L (ref 135–145)
Total Bilirubin: 0.7 mg/dL (ref 0.3–1.2)
Total Protein: 7.3 g/dL (ref 6.5–8.1)

## 2019-12-25 LAB — I-STAT BETA HCG BLOOD, ED (MC, WL, AP ONLY): I-stat hCG, quantitative: <5 m[IU]/mL (ref <5)

## 2019-12-25 LAB — LIPASE, BLOOD: Lipase: 24 U/L (ref 11–51)

## 2019-12-25 MED ORDER — LIDOCAINE 5 % EX PTCH
1.0000 | MEDICATED_PATCH | Freq: Once | CUTANEOUS | Status: DC
  Administered 2019-12-25: 1 via TRANSDERMAL
  Filled 2019-12-25: qty 1

## 2019-12-25 MED ORDER — IOHEXOL 300 MG/ML  SOLN
100.0000 mL | Freq: Once | INTRAMUSCULAR | Status: AC | PRN
  Administered 2019-12-25: 100 mL via INTRAVENOUS

## 2019-12-25 NOTE — ED Provider Notes (Signed)
[facility] DEPT Provider Note   CSN: [id number] Arrival date & time: 12/25/19  1359     History Chief Complaint  Patient presents with  . Flank Pain    Gina Bauer is a 49 y.o. female with past medical history significant for bladder disorder.  No abdominal surgical history.  HPI Patient presents to emergency department today with chief complaint of flank pain x 3 weeks.  She states the pain is located in her right flank.  The pain has been constant and waxes and wanes in severity.  She saw PCP on pain for started and had a right upper quadrant ultrasound that was negative.  She was given a referral to follow-up with GI.  She has a HIDA scan scheduled in 10 days and GI consult at the end of this month.  Patient states her pain has acutely worsened and she cannot wait for the scan.  She describes the pain as an aching sensation.  Pain does not radiate.  Pain is worse with movement.  She states with the pain she becomes nauseous, she only had one episode of emesis since symptom onset.  She rates the pain 7 of 10 in severity.  She denies any injury or fall.  Tried seeing PCP today however was unable to get an appointment.  She went to urgent care where she had a chest x-ray that was unremarkable.  She gave a urine sample that showed blood.  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  She has been worked up for this by urology and has been no cause found.  She denies any gross hematuria.  She has not been taking any medications for her symptoms.  She denies any association of her pain after eating.  Does not frequently eat fatty or greasy foods.  Denies any fever, chills, weight loss, fatigue, cough, chest pain, shortness of breath, pelvic pain, vaginal bleeding, dysuria, urinary frequency, diarrhea. No respiratory symptoms. Last bowel movement was today and was normal per patient.    Past Medical History:  Diagnosis Date  . Bladder disorder       Patient Active Problem List   Diagnosis Date Noted  . PELVIC  PAIN 07/07/2007    Past Surgical History:  Procedure Laterality Date  . DENTAL SURGERY    . MOUTH SURGERY    . WISDOM TOOTH EXTRACTION       OB History    Gravida  6   Para  3   Term      Preterm      AB      Living  3     SAB      TAB      Ectopic      Multiple      Live Births              Family History  Problem Relation Age of Onset  . Breast cancer Mother   . Diabetes Mother     Social History   Tobacco Use  . Smoking status: Never Smoker  . Smokeless tobacco: Never Used  Vaping Use  . Vaping Use: Never used  Substance Use Topics  . Alcohol use: Yes    Comment: occ  . Drug use: No    Home Medications Prior to Admission medications   Medication Sig Start Date End Date Taking? Authorizing Provider  Ashwagandha 125 MG CAPS Take 2 capsules by mouth daily.    Yes [provider]  Multiple Vitamin (MULTIVITAMIN) tablet Take 1 tablet by mouth daily.   Yes [provider]  norgestimate-ethinyl estradiol (ESTARYLLA) 0.25-35 MG-MCG tablet TAKE 1 ACTIVE TABLET DAILY, SKIPPING PLACEBO TABLET 11/28/19  Yes [name], MD  Omega-3 Fatty Acids (OMEGA 3 500 PO) Take 1 capsule by mouth daily.    Yes [provider]  OVER THE COUNTER MEDICATION Take 1 capsule by mouth daily. probiotic    Yes [provider]  vitamin B-12 (CYANOCOBALAMIN) 100 MCG tablet Take 100 mcg by mouth daily.   Yes [provider]  vitamin E 400 UNIT capsule Take 400 Units by mouth daily.   Yes [provider]    Allergies    Patient has no known allergies.  Review of Systems   Review of Systems  All other systems are reviewed and are negative for acute change except as noted in the HPI.   Physical Exam Updated Vital Signs BP 122/77 (BP Location: Left Arm)   Pulse 88   Temp 98.8 F (37.1 C) (Oral)   Resp 16   Ht 5\' 1"  (1.549 m)   Wt 56.7 kg    SpO2 98%   BMI 23.62 kg/m   Physical Exam Vitals and nursing note reviewed.  Constitutional:      General: She is not in acute distress.    Appearance: She is not ill-appearing.  HENT:     Head: Normocephalic and atraumatic.     Right Ear: Tympanic membrane and external ear normal.     Left Ear: Tympanic membrane and external ear normal.     Nose: Nose normal.     Mouth/Throat:     Mouth: Mucous membranes are moist.     Pharynx: Oropharynx is clear.  Eyes:     General: No scleral icterus.       Right eye: No discharge.        Left eye: No discharge.     Extraocular Movements: Extraocular movements intact.     Conjunctiva/sclera: Conjunctivae normal.     Pupils: Pupils are equal, round, and reactive to light.  Neck:     Vascular: No JVD.  Cardiovascular:     Rate and Rhythm: Normal rate and regular rhythm.     Pulses: Normal pulses.          Radial pulses are 2+ on the right side and 2+ on the left side.     Heart sounds: Normal heart sounds.  Pulmonary:     Comments: Lungs clear to auscultation in all fields. Symmetric chest rise. No wheezing, rales, or rhonchi. Abdominal:     Comments: Abdomen is soft, non-distended, nontender in all quadrants. No rigidity, no guarding. No peritoneal signs.  Musculoskeletal:        General: Normal range of motion.       Arms:     Cervical back: Normal range of motion.     Comments: Tenderness to palpation of right flank.  No overlying skin changes.  Skin:    General: Skin is warm and dry.     Capillary Refill: Capillary refill takes less than 2 seconds.  Neurological:     Mental Status: She is oriented to person, place, and time.     GCS: GCS eye subscore is 4. GCS verbal subscore is 5. GCS motor subscore is 6.     Comments: Fluent speech, no facial droop.  Psychiatric:        Behavior: Behavior normal.     ED Results / Procedures /  Treatments   Labs (all labs ordered are listed, but only abnormal results are displayed) Labs  Reviewed  URINALYSIS, ROUTINE W REFLEX MICROSCOPIC - Abnormal; Notable for the following components:      Result Value   Hgb urine dipstick MODERATE (*)    All other components within normal limits  COMPREHENSIVE METABOLIC PANEL - Abnormal; Notable for the following components:   Potassium 3.4 (*)    Alkaline Phosphatase 37 (*)    All other components within normal limits  CBC WITH DIFFERENTIAL/PLATELET  LIPASE, BLOOD  I-STAT BETA HCG BLOOD, ED ([facility], [facility], [facility] ONLY)    EKG None  Radiology CT ABDOMEN PELVIS W CONTRAST  Result Date: 12/25/2019 CLINICAL DATA:  Right-sided flank pain EXAM: CT ABDOMEN AND PELVIS WITH CONTRAST TECHNIQUE: Multidetector CT imaging of the abdomen and pelvis was performed using the standard protocol following bolus administration of intravenous contrast. CONTRAST:  144mL OMNIPAQUE IOHEXOL 300 MG/ML  SOLN COMPARISON:  None. FINDINGS: Lower chest: The visualized heart size within normal limits. No pericardial fluid/thickening. No hiatal hernia. The visualized portions of the lungs are clear. Hepatobiliary: There is a 1.5 cm low-density lesion seen at the posterior aspect of the right liver lobe. For the main portal vein is patent. No evidence of calcified gallstones, gallbladder wall thickening or biliary dilatation. Pancreas: Unremarkable. No pancreatic ductal dilatation or surrounding inflammatory changes. Spleen: Normal in size without focal abnormality. Adrenals/Urinary Tract: Both adrenal glands appear normal. The kidneys and collecting system appear normal without evidence of urinary tract calculus or hydronephrosis. Small parapelvic cysts seen within the lower pole the right kidney. Bladder is unremarkable. Stomach/Bowel: The stomach, small bowel, and colon are normal in appearance. No inflammatory changes, wall thickening, or obstructive findings.The appendix is not well seen, however there is no inflammatory changes seen in the right lower quadrant. Vascular/Lymphatic:  There are no enlarged mesenteric, retroperitoneal, or pelvic lymph nodes. No significant vascular findings are present. Reproductive: A large heterogeneously enhancing uterine fibroid seen at the superior aspect. A small amount of physiologic free fluid seen within the deep pelvis. Other: No evidence of abdominal wall mass or hernia. Musculoskeletal: No acute or significant osseous findings. IMPRESSION: No renal or collecting system calculi. Large heterogeneously enhancing uterine fibroid. Moderate amount of colonic stool. Electronically Signed   By: Prudencio Pair M.D.   On: 12/25/2019 19:06    Procedures Procedures (including critical care time)  Medications Ordered in ED Medications  lidocaine (LIDODERM) 5 % 1 patch (1 patch Transdermal Patch Applied 12/25/19 1629)  iohexol (OMNIPAQUE) 300 MG/ML solution 100 mL (100 mLs Intravenous Contrast Given 12/25/19 1845)    ED Course  I have reviewed the triage vital signs and the nursing notes.  Pertinent labs & imaging results that were available during my care of the patient were reviewed by me and considered in my medical decision making (see chart for details).    MDM Rules/Calculators/A&P                          History provided by patient with additional history obtained from chart review.  Patient presents to the ED with complaints of right flank pain. Patient nontoxic appearing, in no apparent distress, vitals WNL. On exam patient tender to palpation in right flank, no CVA tenderness, no peritoneal signs. Will evaluate with labs and CT abdomen pelvis.  Patient declines need for analgesics or antiemetics. Labs reviewed and grossly unremarkable. No leukocytosis, no anemia, no significant electrolyte derangements.  LFTs, renal function, and lipase WNL. Urinalysis without obvious infection.  UA shows moderate hemoglobinuria, patient being worked up as an outpatient.   Imaging shows a large uterine fibroid as well as a 1.5 cm lesion on right lower  lobe.  Also has some moderate stool noted in the colon.  Discussed findings with patient and her spouse at the bedside.  On repeat exam patient has no peritoneal signs.  Patient tolerating PO in the emergency department. Will discharge home with supportive measures and recommend PCP and OB/GYN follow-up. I discussed results, treatment plan, need for PCP follow-up, and return precautions with the patient. Provided opportunity for questions, patient confirmed understanding and is in agreement with plan.       Portions of this note were generated with [employer]. Dictation errors may occur despite best attempts at proofreading.   Final Clinical Impression(s) / ED Diagnoses Final diagnoses:  Flank pain  Uterine leiomyoma, unspecified location    Rx / DC Orders ED Discharge Orders    None       Flint Melter 12/25/19 1954    Dorie Rank, MD 12/26/19 1559

## 2019-12-25 NOTE — ED Triage Notes (Signed)
Patient c/o right flank since mid September. Patient states she saw her PCP at that time. Patient states she was told it was her gallbladder. Patient states that she went to an UC care today and "they took x-rays." Patient denies any urinary frequency or dysuria.

## 2019-12-25 NOTE — Discharge Instructions (Addendum)
The CT scan today shows you have a large uterine fibroid and a 1.5 cm lesion on your right liver lobe.  Please follow up with your OBGYN to further discuss the fibroid.  Also recommend you follow up with primary care doctor and keep GI appointment if needed.  Thank you for allowing [place] to care for you today.

## 2019-12-26 ENCOUNTER — Ambulatory Visit (INDEPENDENT_AMBULATORY_CARE_PROVIDER_SITE_OTHER): Payer: BC Managed Care – PPO | Admitting: Obstetrics & Gynecology

## 2019-12-26 ENCOUNTER — Encounter: Payer: Self-pay | Admitting: Obstetrics & Gynecology

## 2019-12-26 VITALS — BP 122/80

## 2019-12-26 DIAGNOSIS — N766 Ulceration of vulva: Secondary | ICD-10-CM

## 2019-12-26 DIAGNOSIS — R109 Unspecified abdominal pain: Secondary | ICD-10-CM | POA: Diagnosis not present

## 2019-12-26 DIAGNOSIS — R1900 Intra-abdominal and pelvic swelling, mass and lump, unspecified site: Secondary | ICD-10-CM

## 2019-12-26 MED ORDER — VALACYCLOVIR HCL 1 G PO TABS: 1000.0000 mg | ORAL_TABLET | Freq: Every day | ORAL | 3 refills | Status: AC

## 2019-12-26 NOTE — Progress Notes (Signed)
Gina Bauer [birth year] [id number]        49 y.o.  [id number] Married.  Husband present at the visit.  RP: Rt back and abdominal pain x 3 weeks.  HPI:  Rt back and abdominal pain x 3 weeks.  Urgent care 12/07/2019 with Abdominal US not showing GB stone or inflammation.  ER 12/25/2019 with CT Scan showing an heterogeneous probable Uterine Fibroid.  No renal stone seen, no Hydronephrosis.  CBC normal.  Patient also c/o burning painful bumps on the vulva.  Not the first time she has them, but no diagnosis made.  OB History  Gravida Para Term Preterm AB Living  6 3       3   SAB TAB Ectopic Multiple Live Births               # Outcome Date GA Lbr Len/2nd Weight Sex Delivery Anes PTL Lv  6 Gravida           5 Gravida           4 Gravida           3 Para           2 Para           1 Para             Past medical history,surgical history, problem list, medications, allergies, family history and social history were all reviewed and documented in the EPIC chart.   Directed ROS with pertinent positives and negatives documented in the history of present illness/assessment and plan.  Exam:  Vitals:   12/26/19 0937  BP: 122/80   General appearance:  Normal  CVAT Positive on the Rt side  Abdomen: Rt abdomen tender to palpation.  No mass felt  Gynecologic exam: Vulva with ulcerations Rt > Lt.  HSV Sureswab done.  Bimanual exam:  Uterus AV, mobile.  Left adnexa negative.  Right adnexa difficult to evaluate d/t pain/tenderness.  CBC normal 12/25/2019 with Hb at 12 and WBC at 8.0  CT abdo-pelvis 12/25/2019:  Hepatobiliary: There is a 1.5 cm low-density lesion seen at the posterior aspect of the right liver lobe. For the main portal vein is patent. No evidence of calcified gallstones, gallbladder wall thickening or biliary dilatation.  Pancreas: Unremarkable. No pancreatic ductal dilatation or surrounding inflammatory changes.  Spleen: Normal in size without focal  abnormality.  Adrenals/Urinary Tract: Both adrenal glands appear normal. The kidneys and collecting system appear normal without evidence of urinary tract calculus or hydronephrosis. Small parapelvic cysts seen within the lower pole the right kidney. Bladder is unremarkable.  Stomach/Bowel: The stomach, small bowel, and colon are normal in appearance. No inflammatory changes, wall thickening, or obstructive findings.The appendix is not well seen, however there is no inflammatory changes seen in the right lower quadrant.  Vascular/Lymphatic: There are no enlarged mesenteric, retroperitoneal, or pelvic lymph nodes. No significant vascular findings are present.  Reproductive: A large heterogeneously enhancing uterine fibroid seen at the superior aspect. A small amount of physiologic free fluid seen within the deep pelvis.   Abdominal US 12/07/2019: Gallbladder:  No gallstones or wall thickening visualized. No sonographic Murphy sign noted by sonographer.  Common bile duct:  Diameter: Normal caliber, 3 mm  Liver:  No focal lesion identified. Within normal limits in parenchymal echogenicity. Portal vein is patent on color Doppler imaging with normal direction of blood flow towards the liver.  Assessment/Plan:  49 y.o. [id number]  1. Right sided abdominal pain  Right abdominal pain with back pain and flank pain.  No evidence of gallbladder disease or renal disease on CT scan.  A large heterogeneously enhancing uterine fibroid was seen at the superior aspect of the uterus.  We will further investigate with a pelvic ultrasound here as soon as possible.  Ovarian mass not ruled out.  Ca125 and CEA drawn today.  Patient will control the pain with ibuprofen until follow-up. - CA 125 - CEA - US Transvaginal Non-OB; Future  2. Pelvic mass in female Probably degenerating uterine fibroid, but right ovarian mass not ruled out.  Follow-up pelvic ultrasound here as soon as possible.   Ca125 and CEA drawn. - CA 125 - CEA - US Transvaginal Non-OB; Future  3. Vulvar ulcer Vulvar ulcers, rule out genital HSV.  Sure swab HSV done.  Decision to prescribe Valtrex 1000 mg 1 tablet per mouth daily for 5 days. - SureSwab HSV, Type 1/2 DNA, PCR  Other orders - valACYclovir (VALTREX) 1000 MG tablet; Take 1 tablet (1,000 mg total) by mouth daily for 5 days.  [name] MD, 10:16 AM 12/26/2019

## 2019-12-27 ENCOUNTER — Encounter: Payer: Self-pay | Admitting: Obstetrics & Gynecology

## 2019-12-27 LAB — CA 125: CA 125: 4 U/mL (ref <35)

## 2019-12-27 LAB — CEA: CEA: 0.6 ng/mL

## 2019-12-28 ENCOUNTER — Ambulatory Visit (INDEPENDENT_AMBULATORY_CARE_PROVIDER_SITE_OTHER): Payer: BC Managed Care – PPO

## 2019-12-28 ENCOUNTER — Encounter: Payer: Self-pay | Admitting: Obstetrics & Gynecology

## 2019-12-28 ENCOUNTER — Encounter: Payer: Self-pay | Admitting: Gastroenterology

## 2019-12-28 ENCOUNTER — Ambulatory Visit: Payer: BC Managed Care – PPO | Admitting: Obstetrics & Gynecology

## 2019-12-28 ENCOUNTER — Telehealth: Payer: Self-pay

## 2019-12-28 ENCOUNTER — Other Ambulatory Visit: Payer: Self-pay

## 2019-12-28 DIAGNOSIS — N854 Malposition of uterus: Secondary | ICD-10-CM | POA: Diagnosis not present

## 2019-12-28 DIAGNOSIS — R109 Unspecified abdominal pain: Secondary | ICD-10-CM

## 2019-12-28 DIAGNOSIS — R1900 Intra-abdominal and pelvic swelling, mass and lump, unspecified site: Secondary | ICD-10-CM

## 2019-12-28 DIAGNOSIS — N766 Ulceration of vulva: Secondary | ICD-10-CM

## 2019-12-28 NOTE — Progress Notes (Signed)
    Gina Bauer 28-Jul-1970 [id number]        49 y.o.  [id number] Accompanied by husband  RP: Rt abdominal pain/mass per CT scan for Pelvic US  HPI: Right abdominal pain improved after 2 doses of prednisone for probable right musculoskeletal pain at the lower rib cage.  Vulvar burning pain improving on Valtrex.   OB History  Gravida Para Term Preterm AB Living  6 3       3   SAB TAB Ectopic Multiple Live Births               # Outcome Date GA Lbr Len/2nd Weight Sex Delivery Anes PTL Lv  6 Gravida           5 Gravida           4 Gravida           3 Para           2 Para           1 Para             Past medical history,surgical history, problem list, medications, allergies, family history and social history were all reviewed and documented in the EPIC chart.   Directed ROS with pertinent positives and negatives documented in the history of present illness/assessment and plan.  Exam:  There were no vitals filed for this visit. General appearance:  Normal  Pelvic US today: T/V and T/A images.  Retroverted uterus normal in size and shape with no myometrial mass.  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  The uterus is measured at 9.83 x 6.08 x 6.53.  The endometrial lining is thin and symmetrical with a sliver of fluid.  The endometrial lining is measured at 4.87 mm.  No mass or thickening seen at the endometrium.  Both ovaries are normal in size.  No adnexal mass.  No free fluid in the posterior cul-de-sac.  Ca 125 normal at 4 CEA normal at 0.6   Assessment/Plan:  49 y.o. [id number]  1. Right sided abdominal pain No evidence of any gynecologic cause for patient's right sided abdominal pain.  The uterus appears fibroadenomatous, but no distinct fibroid was found by pelvic ultrasound.  Both ovaries were normal in size and appearance.  Ca125 and CEA were normal.  Will ask radiology to reread the recent pelvic CT scan, as the uterus was probably misread  as a heterogenous large fibroid.  Patient seen by her family physician and started on prednisone for probable musculoskeletal pain at the lower rib cage, which is improving her right sided pain after 2 doses.  If no complete resolution of the pain, will refer to urology as a mild pyelectasis was seen on the right kidney by ultrasound.  No hydronephrosis.  2. Vulvar ulcer HSV culture still pending.  Burning pain improving on Valtrex since last visit.  [name] MD, 10:04 AM 12/28/2019

## 2019-12-28 NOTE — Telephone Encounter (Signed)
At Dr. Assunta Curtis request I called Radiology to request CT report be re-read.  I spoke with Valarie Merino in [place] and relayed all the information Dr. Marguerita Merles had provided. They could already see the u/s results from today.   He took all of Dr. Marguerita Merles requests/information and will relay to radiologist to have them reread the CT scan.   They will either call or reroute it to Dr. Marguerita Merles with addendum.

## 2019-12-29 LAB — SURESWAB HSV, TYPE 1/2 DNA, PCR
HSV 1 DNA: NOT DETECTED
HSV 2 DNA: NOT DETECTED

## 2020-01-02 ENCOUNTER — Ambulatory Visit: Payer: BC Managed Care – PPO | Admitting: Psychiatry

## 2020-01-30 ENCOUNTER — Ambulatory Visit: Payer: BC Managed Care – PPO | Admitting: Psychiatry

## 2020-02-13 ENCOUNTER — Ambulatory Visit: Payer: BC Managed Care – PPO | Admitting: Psychiatry

## 2020-02-23 ENCOUNTER — Ambulatory Visit: Payer: BC Managed Care – PPO | Admitting: Gastroenterology

## 2020-02-23 ENCOUNTER — Encounter: Payer: Self-pay | Admitting: Gastroenterology

## 2020-02-23 ENCOUNTER — Other Ambulatory Visit (INDEPENDENT_AMBULATORY_CARE_PROVIDER_SITE_OTHER): Payer: BC Managed Care – PPO

## 2020-02-23 VITALS — BP 104/82 | HR 82 | Ht 62.0 in | Wt 126.0 lb

## 2020-02-23 DIAGNOSIS — R1011 Right upper quadrant pain: Secondary | ICD-10-CM | POA: Diagnosis not present

## 2020-02-23 DIAGNOSIS — R932 Abnormal findings on diagnostic imaging of liver and biliary tract: Secondary | ICD-10-CM | POA: Diagnosis not present

## 2020-02-23 DIAGNOSIS — R109 Unspecified abdominal pain: Secondary | ICD-10-CM

## 2020-02-23 LAB — CREATININE, SERUM: Creatinine, Ser: 0.68 mg/dL (ref 0.40–1.20)

## 2020-02-23 LAB — BUN: BUN: 11 mg/dL (ref 6–23)

## 2020-02-23 MED ORDER — LANSOPRAZOLE 30 MG PO CPDR: 30.0000 mg | DELAYED_RELEASE_CAPSULE | Freq: Every morning | ORAL | 0 refills | Status: DC

## 2020-02-23 MED ORDER — SUPREP BOWEL PREP KIT 17.5-3.13-1.6 GM/177ML PO SOLN: 1.0000 | ORAL | 0 refills | Status: DC

## 2020-02-23 NOTE — Patient Instructions (Addendum)
I have recommended an MRI to follow-up on your CT scan. Please don't spend any more time on the internet researching or worrying about this!   You have been scheduled for an MRI at at [facility] (1st floor of hospital) on 03/01/20 at 4pm. Please arrive at 3:30pm for registration purposes. Please make certain not to have anything to eat or drink 4 hours prior to your test. In addition, if you have any metal in your body, have a pacemaker or defibrillator, please be sure to let your ordering physician know. This test typically takes 45 minutes to 1 hour to complete. Should you need to reschedule, please call [phone] to do so.  LABS: Your provider has requested that you go to the basement level for lab work before leaving today. Press "B" on the elevator. The lab is located at the first door on the left as you exit the elevator.  HEALTHCARE LAWS AND MY CHART RESULTS: Due to recent changes in healthcare laws, you may see the results of your imaging and laboratory studies on MyChart before your provider has had a chance to review them.  We understand that in some cases there may be results that are confusing or concerning to you. Not all laboratory results come back in the same time frame and the provider may be waiting for multiple results in order to interpret others.  Please give [place] 48 hours in order for your provider to thoroughly review all the results before contacting the office for clarification of your results.   I have recommend that you start taking lanzoprazole every day - ideally 30-60 minutes prior to breakfast.  PRESCRIPTION MEDICATION(S): We have sent the following medication(s) to your pharmacy:  . Lansoprazole - Please take 30mg  by mouth every morning  Stop using meloxicam, as this can cause inflammation throughout the GI tract. Please talk to Dr. Ruben Gottron about alternatives.    Will plan an EGD to evaluate your symptoms and a colonoscopy for colon cancer screening in  January. Between now and then, keep a food diary to see if we can identify any foods that may be triggering your symptoms. Please call with any questions or concerns in the meantime.   ENDOSCOPY AND COLONOSCOPY: You have been scheduled for a endoscopy and colonoscopy. Please follow written instructions given to you at your visit today.   PREP: Please pick up your prep supplies at the pharmacy within the next 1-3 days.  INHALERS: If you use inhalers (even only as needed), please bring them with you on the day of your procedure.  We will obtain records from your ENT and Gastroenterologist.  If you are age 40 or younger, your body mass index should be between 19-25. Your Body mass index is 23.05 kg/m. If this is out of the aformentioned range listed, please consider follow up with your Primary Care Provider.   Thank you for trusting me with your gastrointestinal care!    Thornton Park, MD, MPH

## 2020-02-23 NOTE — Progress Notes (Signed)
Referring Provider: Robyne Peers, MD Primary Care Physician:  Robyne Peers, MD  Reason for Consultation:  Pain and abnormal CT scan   IMPRESSION:  RUQ/Right flank pain not explained by ultrasound or CT Abnormal liver on CT scan: 1.5 cm lesion seen in the liver Moderate amount of stool in the colon by CT No prior colon cancer screening No known family history of colon cancer or polyps  RUQ/Right flank pain occurring in the setting of meloxicam use. Not explained by ultrasound or CT. EGD recommended to evaluate for NSAID-related PUD, gastritis, esophagitis, H pylori. Will start daily PPI therapy in the meantime. She will keep a food diary in the meantime.   Abnormal liver on CT scan: 1.5 cm lesion seen in the liver. Not the cause of her pain. MRI recommended for clarification of the lesion.   Colonoscopy recommended for colon cancer screening.   PLAN: Lanzoprazole 30 mg QAM Food diary EGD Colonoscopy MRI with and without contrast Obtain records from prior GI and ENT  The nature of the procedures, as well as the risks, benefits, and alternatives were carefully and thoroughly reviewed with the patient. Ample time for discussion and questions allowed. The patient understood, was satisfied, and agreed to proceed.  Please see the "Patient Instructions" section for addition details about the plan.  HPI: Gina Bauer is a 49 y.o. female referred by Dr. Ruben Gottron for side pain. The history is obtained through the patient, her husband who accompanies her to this appointment. and review of her electronic health record.  Previously seen by gastroenterologist in [facility] 10 years ago for right sided abdominal pain. Her husband accompanies her to this appointment. She has a history of anemia, anxiety, pneumonia. She works as a Transport planner.   RUQ/Right flank/back pain. Initially started while on vacation, celebrating her 49th birthday, and drinking more alcohol than normal.  Right  abdominal pain improved after 2 doses of prednisone for probable right musculoskeletal pain at the lower rib cage, and resolved within one month. Previously using melaxicam. No change in bowel habits or blood in the stool.  Evaluated by ENT for a lump in her throat a few years ago that was ultimately attributed to reflux. Started using lansoprazole 15 mg PRN at that time.   She feels like symptoms could be food related. Does not eat any dairy to minimize bloating. Tries to eat healthy.  No other special diets.   Recent evaluation:  Ultrasound normal 12/07/19 Ca125 and CEA were normal Labs 12/25/19: normal CMP, lipase, CBC Abdominal x-ray 12/25/19: normal bowel gas pattern CT abd/pelvis 12/25/19: globular uterine, moderate stool in the colon, 1.5 cm low density lesion in the posterior aspect of the right hepatic lobe HIDA scan was recommended but then cancelled  No known family history of colon cancer or polyps. No family history of uterine/endometrial cancer, pancreatic cancer or gastric/stomach cancer.   Past Medical History:  Diagnosis Date  . Bladder disorder     Past Surgical History:  Procedure Laterality Date  . DENTAL SURGERY    . MOUTH SURGERY    . WISDOM TOOTH EXTRACTION      Current Outpatient Medications  Medication Sig Dispense Refill  . Ashwagandha 125 MG CAPS Take 2 capsules by mouth daily.     . lansoprazole (PREVACID) 15 MG capsule Take 15 mg by mouth daily at 12 noon. As needed    . Multiple Vitamin (MULTIVITAMIN) tablet Take 1 tablet by mouth daily.    . norgestimate-ethinyl  estradiol (ESTARYLLA) 0.25-35 MG-MCG tablet TAKE 1 ACTIVE TABLET DAILY, SKIPPING PLACEBO TABLET 112 tablet 3  . Omega-3 Fatty Acids (OMEGA 3 500 PO) Take 1 capsule by mouth daily.     Marland Kitchen OVER THE COUNTER MEDICATION Take 1 capsule by mouth daily. probiotic     . vitamin B-12 (CYANOCOBALAMIN) 100 MCG tablet Take 100 mcg by mouth daily.    . vitamin E 400 UNIT capsule Take 400 Units by mouth daily.      No current facility-administered medications for this visit.    Allergies as of 02/23/2020  . (No Known Allergies)    Family History  Problem Relation Age of Onset  . Breast cancer Mother   . Diabetes Mother   . Heart disease Mother   . Heart failure Mother   . Colon polyps Father   . Prostate cancer Father   . Alzheimer's disease Father   . Colon cancer Neg Hx   . Stomach cancer Neg Hx   . Esophageal cancer Neg Hx   . Pancreatic cancer Neg Hx     Social History   Socioeconomic History  . Marital status: Married    Spouse name: Not on file  . Number of children: Not on file  . Years of education: Not on file  . Highest education level: Not on file  Occupational History  . Not on file  Tobacco Use  . Smoking status: Never Smoker  . Smokeless tobacco: Never Used  Vaping Use  . Vaping Use: Never used  Substance and Sexual Activity  . Alcohol use: Yes    Comment: occ  . Drug use: No  . Sexual activity: Yes    Partners: Male    Birth control/protection: Pill    Comment: 1st intercourse- 68, partnres- 2, married- 23 yrs   Other Topics Concern  . Not on file  Social History Narrative  . Not on file   Social Determinants of Health   Financial Resource Strain:   . Difficulty of Paying Living Expenses: Not on file  Food Insecurity:   . Worried About Charity fundraiser in the Last Year: Not on file  . Ran Out of Food in the Last Year: Not on file  Transportation Needs:   . Lack of Transportation (Medical): Not on file  . Lack of Transportation (Non-Medical): Not on file  Physical Activity:   . Days of Exercise per Week: Not on file  . Minutes of Exercise per Session: Not on file  Stress:   . Feeling of Stress : Not on file  Social Connections:   . Frequency of Communication with Friends and Family: Not on file  . Frequency of Social Gatherings with Friends and Family: Not on file  . Attends Religious Services: Not on file  . Active Member of Clubs or  Organizations: Not on file  . Attends Archivist Meetings: Not on file  . Marital Status: Not on file  Intimate Partner Violence:   . Fear of Current or Ex-Partner: Not on file  . Emotionally Abused: Not on file  . Physically Abused: Not on file  . Sexually Abused: Not on file    Review of Systems: 12 system ROS is negative except as noted above with the additions of headaches, menstrual pain, and muscle pains.   Physical Exam: General:   Alert,  well-nourished, pleasant and cooperative in NAD Head:  Normocephalic and atraumatic. Eyes:  Sclera clear, no icterus.   Conjunctiva pink. Ears:  Normal  auditory acuity. Nose:  No deformity, discharge,  or lesions. Mouth:  No deformity or lesions.   Neck:  Supple; no masses or thyromegaly. Lungs:  Clear throughout to auscultation.   No wheezes. Heart:  Regular rate and rhythm; no murmurs. Abdomen:  Soft,nontender, nondistended, normal bowel sounds, no rebound or guarding. No hepatosplenomegaly.   Rectal:  Deferred  Msk:  Symmetrical. No boney deformities LAD: No inguinal or umbilical LAD Extremities:  No clubbing or edema. Neurologic:  Alert and  oriented x4;  grossly nonfocal Skin:  Intact without significant lesions or rashes. Psych:  Alert and cooperative. Normal mood and affect.     Yuki Purves L. Tarri Glenn, MD, MPH 02/23/2020, 9:46 AM

## 2020-02-27 ENCOUNTER — Ambulatory Visit: Payer: BC Managed Care – PPO | Admitting: Psychiatry

## 2020-03-01 ENCOUNTER — Other Ambulatory Visit: Payer: Self-pay

## 2020-03-01 ENCOUNTER — Ambulatory Visit (HOSPITAL_COMMUNITY)
Admission: RE | Admit: 2020-03-01 | Discharge: 2020-03-01 | Disposition: A | Discharge status: Home / Self Care | Payer: BC Managed Care – PPO | Source: Ambulatory Visit | Attending: Gastroenterology | Admitting: Gastroenterology

## 2020-03-01 DIAGNOSIS — R1011 Right upper quadrant pain: Secondary | ICD-10-CM | POA: Diagnosis present

## 2020-03-01 DIAGNOSIS — R932 Abnormal findings on diagnostic imaging of liver and biliary tract: Secondary | ICD-10-CM | POA: Diagnosis not present

## 2020-03-01 DIAGNOSIS — R109 Unspecified abdominal pain: Secondary | ICD-10-CM

## 2020-03-01 MED ORDER — GADOBUTROL 1 MMOL/ML IV SOLN
6.0000 mL | Freq: Once | INTRAVENOUS | Status: AC | PRN
  Administered 2020-03-01: 6 mL via INTRAVENOUS

## 2020-03-12 ENCOUNTER — Other Ambulatory Visit: Payer: Self-pay

## 2020-03-12 ENCOUNTER — Encounter: Payer: Self-pay | Admitting: Psychiatry

## 2020-03-12 ENCOUNTER — Ambulatory Visit (INDEPENDENT_AMBULATORY_CARE_PROVIDER_SITE_OTHER): Payer: BC Managed Care – PPO | Admitting: Psychiatry

## 2020-03-12 DIAGNOSIS — F4322 Adjustment disorder with anxiety: Secondary | ICD-10-CM

## 2020-03-12 NOTE — Progress Notes (Signed)
      [facility] Counselor/Therapist Progress Note  Patient ID: RECIE CIRRINCIONE, MRN: [id number],    Date: 03/12/2020  Time Spent: 50 minutes   Treatment Type: Individual Therapy  Reported Symptoms: anxiety  Mental Status Exam:  Appearance:   Well Groomed     Behavior:  Appropriate  Motor:  Normal  Speech/Language:   Clear and Coherent  Affect:  Appropriate  Mood:  anxious  Thought process:  normal  Thought content:    WNL  Sensory/Perceptual disturbances:    WNL  Orientation:  oriented to person, place, time/date and situation  Attention:  Good  Concentration:  Good  Memory:  WNL  Fund of knowledge:   Good  Insight:    Good  Judgment:   Good  Impulse Control:  Good   Risk Assessment: Danger to Self:  No Self-injurious Behavior: No Danger to Others: No Duty to Warn:no Physical Aggression / Violence:No  Access to Firearms a concern: No  Gang Involvement:No   Subjective: 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  She has been anxious about the process of developing a client base.  She states she has been working with some social media to help generate new business.  Otherwise she has been feeling increased anxiety without clients to see.  I encouraged the client to stay mindful and to not going to the future. The client states that things have been going well with her family.  Her youngest son has been showing some obsessive-compulsive behavior which worries the client.  She found that he had prayed 30 [facility] in a row.  I discussed with the client that with her son's anxiety the repetition Criston Chancellor be more of a self soothing behavior for him.  The client believes this Anica Alcaraz be true.  She drew parallels between her youngest son and her oldest son.  I encouraged the client to continue to practice mood independent behavior.  Try not to to read in what is going on with her son.Marland Kitchen  She will also focus on staying out of thinking errors such  as fortune telling or mind-reading.  Interventions: Mindfulness Meditation, Motivational Interviewing, Solution-Oriented/Positive Psychology and Insight-Oriented  Diagnosis:   ICD-10-CM   1. Adjustment disorder with anxiety  F43.22     Plan: Avoid thinking errors, mood independent behavior, self-care, positive self talk, exercise, mindfulness.  Pia Jedlicka, [facility]

## 2020-03-26 ENCOUNTER — Encounter: Payer: Self-pay | Admitting: Obstetrics & Gynecology

## 2020-04-11 ENCOUNTER — Other Ambulatory Visit: Payer: Self-pay | Admitting: Gastroenterology

## 2020-04-11 ENCOUNTER — Encounter: Payer: Self-pay | Admitting: Gastroenterology

## 2020-04-11 ENCOUNTER — Other Ambulatory Visit: Payer: Self-pay

## 2020-04-11 ENCOUNTER — Ambulatory Visit (AMBULATORY_SURGERY_CENTER): Payer: BC Managed Care – PPO | Admitting: Gastroenterology

## 2020-04-11 VITALS — BP 104/52 | HR 84 | Temp 98.1°F | Resp 18 | Ht 62.0 in | Wt 126.0 lb

## 2020-04-11 DIAGNOSIS — R1084 Generalized abdominal pain: Secondary | ICD-10-CM | POA: Diagnosis not present

## 2020-04-11 DIAGNOSIS — K208 Other esophagitis without bleeding: Secondary | ICD-10-CM | POA: Diagnosis not present

## 2020-04-11 DIAGNOSIS — K319 Disease of stomach and duodenum, unspecified: Secondary | ICD-10-CM | POA: Diagnosis not present

## 2020-04-11 DIAGNOSIS — K5730 Diverticulosis of large intestine without perforation or abscess without bleeding: Secondary | ICD-10-CM | POA: Diagnosis not present

## 2020-04-11 DIAGNOSIS — R1011 Right upper quadrant pain: Secondary | ICD-10-CM

## 2020-04-11 DIAGNOSIS — K648 Other hemorrhoids: Secondary | ICD-10-CM | POA: Diagnosis not present

## 2020-04-11 DIAGNOSIS — K3189 Other diseases of stomach and duodenum: Secondary | ICD-10-CM | POA: Diagnosis not present

## 2020-04-11 DIAGNOSIS — K2970 Gastritis, unspecified, without bleeding: Secondary | ICD-10-CM

## 2020-04-11 DIAGNOSIS — R932 Abnormal findings on diagnostic imaging of liver and biliary tract: Secondary | ICD-10-CM

## 2020-04-11 MED ORDER — SODIUM CHLORIDE 0.9 % IV SOLN: 500.0000 mL | Freq: Once | INTRAVENOUS | Status: DC

## 2020-04-11 NOTE — Op Note (Signed)
[facility] Patient Name: Gina Bauer Procedure Date: 04/11/2020 2:56 PM MRN: [id number] Endoscopist: Thornton Park MD, MD Age: 50 Referring MD:  Date of Birth: [birth year] Gender: Female Account #: [id number] Procedure:                Upper GI endoscopy Indications:              Abdominal pain in the right upper quadrant not                            explained by ultrasound or CT Medicines:                Monitored Anesthesia Care Procedure:                Pre-Anesthesia Assessment:                           - Prior to the procedure, a History and Physical                            was performed, and patient medications and                            allergies were reviewed. The patient's tolerance of                            previous anesthesia was also reviewed. The risks                            and benefits of the procedure and the sedation                            options and risks were discussed with the patient.                            All questions were answered, and informed consent                            was obtained. Prior Anticoagulants: The patient has                            taken no previous anticoagulant or antiplatelet                            agents. ASA Grade Assessment: II - A patient with                            mild systemic disease. After reviewing the risks                            and benefits, the patient was deemed in                            satisfactory condition to undergo the procedure.  After obtaining informed consent, the endoscope was                            passed under direct vision. Throughout the                            procedure, the patient's blood pressure, pulse, and                            oxygen saturations were monitored continuously. The                            Endoscope was introduced through the mouth, and                            advanced to the third part  of duodenum. The upper                            GI endoscopy was accomplished without difficulty.                            The patient tolerated the procedure well. Scope In: Scope Out: Findings:                 The distal esophagus shows mild erythema and edema.                            Z-line was found 37 cm from the incisors. Biopsies                            were taken with a cold forceps for histology.                            Estimated blood loss was minimal.                           The entire examined stomach was normal. Biopsies                            were taken from the antrum, body, and fundus with a                            cold forceps for histology. Estimated blood loss                            was minimal.                           The examined duodenum was normal. Biopsies were                            taken with a cold forceps for histology. Estimated  blood loss was minimal.                           The cardia and gastric fundus were normal on                            retroflexion.                           The exam was otherwise without abnormality. Complications:            No immediate complications. Estimated blood loss:                            Minimal. Estimated Blood Loss:     Estimated blood loss was minimal. Impression:               - Z-line irregular, 37 cm from the incisors.                            Biopsied.                           - Normal stomach. Biopsied.                           - Normal examined duodenum. Biopsied.                           - The examination was otherwise normal. Recommendation:           - Patient has a contact number available for                            emergencies. The signs and symptoms of potential                            delayed complications were discussed with the                            patient. Return to normal activities tomorrow.                             Written discharge instructions were provided to the                            patient.                           - Resume previous diet.                           - Continue present medications.                           - Await pathology results.                           - Proceed  with colonoscopy as previously planned. Thornton Park MD, MD 04/11/2020 3:56:21 PM This report has been signed electronically.

## 2020-04-11 NOTE — Progress Notes (Signed)
Called to room to assist during endoscopic procedure.  Patient ID and intended procedure confirmed with present staff. Received instructions for my participation in the procedure from the performing physician.  

## 2020-04-11 NOTE — Patient Instructions (Signed)
Please read handouts provided. Continue present medications. Await pathology results.     YOU HAD AN ENDOSCOPIC PROCEDURE TODAY AT THE [facility]:   Refer to the procedure report that was given to you for any specific questions about what was found during the examination.  If the procedure report does not answer your questions, please call your gastroenterologist to clarify.  If you requested that your care partner not be given the details of your procedure findings, then the procedure report has been included in a sealed envelope for you to review at your convenience later.  YOU SHOULD EXPECT: Some feelings of bloating in the abdomen. Passage of more gas than usual.  Walking can help get rid of the air that was put into your GI tract during the procedure and reduce the bloating. If you had a lower endoscopy (such as a colonoscopy or flexible sigmoidoscopy) you may notice spotting of blood in your stool or on the toilet paper. If you underwent a bowel prep for your procedure, you may not have a normal bowel movement for a few days.  Please Note:  You might notice some irritation and congestion in your nose or some drainage.  This is from the oxygen used during your procedure.  There is no need for concern and it should clear up in a day or so.  SYMPTOMS TO REPORT IMMEDIATELY:   Following lower endoscopy (colonoscopy or flexible sigmoidoscopy):  Excessive amounts of blood in the stool  Significant tenderness or worsening of abdominal pains  Swelling of the abdomen that is new, acute  Fever of 100F or higher   Following upper endoscopy (EGD)  Vomiting of blood or coffee ground material  New chest pain or pain under the shoulder blades  Painful or persistently difficult swallowing  New shortness of breath  Fever of 100F or higher  Black, tarry-looking stools  For urgent or emergent issues, a gastroenterologist can be reached at any hour by calling [phone]. Do not  use MyChart messaging for urgent concerns.    DIET:  We do recommend a small meal at first, but then you may proceed to your regular diet.  Drink plenty of fluids but you should avoid alcoholic beverages for 24 hours.  ACTIVITY:  You should plan to take it easy for the rest of today and you should NOT DRIVE or use heavy machinery until tomorrow (because of the sedation medicines used during the test).    FOLLOW UP: Our staff will call the number listed on your records 48-72 hours following your procedure to check on you and address any questions or concerns that you may have regarding the information given to you following your procedure. If we do not reach you, we will leave a message.  We will attempt to reach you two times.  During this call, we will ask if you have developed any symptoms of COVID 19. If you develop any symptoms (ie: fever, flu-like symptoms, shortness of breath, cough etc.) before then, please call [phone].  If you test positive for Covid 19 in the 2 weeks post procedure, please call and report this information to us.    If any biopsies were taken you will be contacted by phone or by letter within the next 1-3 weeks.  Please call us at [phone] if you have not heard about the biopsies in 3 weeks.    SIGNATURES/CONFIDENTIALITY: You and/or your care partner have signed paperwork which will be entered into your electronic medical   record.  These signatures attest to the fact that that the information above on your After Visit Summary has been reviewed and is understood.  Full responsibility of the confidentiality of this discharge information lies with you and/or your care-partner. 

## 2020-04-11 NOTE — Op Note (Signed)
[facility] Patient Name: Gina Bauer Procedure Date: 04/11/2020 2:55 PM MRN: [id number] Endoscopist: Thornton Park MD, MD Age: 50 Referring MD:  Date of Birth: June 11, 1970 Gender: Female Account #: [id number] Procedure:                Colonoscopy Indications:              Screening for colorectal malignant neoplasm, This                            is the patient's first colonoscopy, Incidental                            abdominal pain noted Medicines:                Monitored Anesthesia Care Procedure:                Pre-Anesthesia Assessment:                           - Prior to the procedure, a History and Physical                            was performed, and patient medications and                            allergies were reviewed. The patient's tolerance of                            previous anesthesia was also reviewed. The risks                            and benefits of the procedure and the sedation                            options and risks were discussed with the patient.                            All questions were answered, and informed consent                            was obtained. Prior Anticoagulants: The patient has                            taken no previous anticoagulant or antiplatelet                            agents. ASA Grade Assessment: II - A patient with                            mild systemic disease. After reviewing the risks                            and benefits, the patient was deemed in  satisfactory condition to undergo the procedure.                           After obtaining informed consent, the colonoscope                            was passed under direct vision. Throughout the                            procedure, the patient's blood pressure, pulse, and                            oxygen saturations were monitored continuously. The                            Olympus CF-HQ190 (#[id number]) Colonoscope  was                            introduced through the anus and advanced to the 3                            cm into the ileum. The colonoscopy was performed                            without difficulty. The patient tolerated the                            procedure well. The quality of the bowel                            preparation was excellent. The terminal ileum,                            ileocecal valve, appendiceal orifice, and rectum                            were photographed. Scope In: 3:30:20 PM Scope Out: 3:43:15 PM Scope Withdrawal Time: 0 hours 10 minutes 40 seconds  Total Procedure Duration: 0 hours 12 minutes 55 seconds  Findings:                 The perianal and digital rectal examinations were                            normal.                           Non-bleeding internal hemorrhoids were found.                           A few diverticula were found in the sigmoid colon,                            descending colon and ascending colon.  The exam was otherwise without abnormality on                            direct and retroflexion views. Complications:            No immediate complications. Estimated Blood Loss:     Estimated blood loss: none. Impression:               - Non-bleeding internal hemorrhoids.                           - Diverticulosis in the sigmoid colon, in the                            descending colon and in the ascending colon.                           - The examination was otherwise normal on direct                            and retroflexion views.                           - No specimens collected. Recommendation:           - Patient has a contact number available for                            emergencies. The signs and symptoms of potential                            delayed complications were discussed with the                            patient. Return to normal activities tomorrow.                             Written discharge instructions were provided to the                            patient.                           - Resume previous diet.                           - Continue present medications.                           - Repeat colonoscopy in 10 years for surveillance.                           - Emerging evidence supports eating a diet of                            fruits, vegetables, grains, calcium, and yogurt  while reducing red meat and alcohol may reduce the                            risk of colon cancer.                           - Thank you for allowing me to be involved in your                            colon cancer prevention. Thornton Park MD, MD 04/11/2020 3:59:10 PM This report has been signed electronically.

## 2020-04-11 NOTE — Progress Notes (Signed)
Medical history reviewed with no changes noted. VS assessed by C.W 

## 2020-04-11 NOTE — Progress Notes (Signed)
Report given to PACU, vss 

## 2020-04-15 ENCOUNTER — Telehealth: Payer: Self-pay

## 2020-04-15 NOTE — Telephone Encounter (Signed)
LVM

## 2020-04-19 ENCOUNTER — Ambulatory Visit (INDEPENDENT_AMBULATORY_CARE_PROVIDER_SITE_OTHER): Payer: BC Managed Care – PPO | Admitting: Psychiatry

## 2020-04-19 ENCOUNTER — Other Ambulatory Visit: Payer: Self-pay

## 2020-04-19 ENCOUNTER — Encounter: Payer: Self-pay | Admitting: Psychiatry

## 2020-04-19 DIAGNOSIS — F4322 Adjustment disorder with anxiety: Secondary | ICD-10-CM
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
## 2020-04-24 ENCOUNTER — Other Ambulatory Visit: Payer: Self-pay

## 2020-04-24 MED ORDER — LANSOPRAZOLE 30 MG PO CPDR: 30.0000 mg | DELAYED_RELEASE_CAPSULE | Freq: Two times a day (BID) | ORAL | 6 refills | Status: DC

## 2020-04-29 ENCOUNTER — Other Ambulatory Visit: Payer: Self-pay

## 2020-04-29 ENCOUNTER — Ambulatory Visit (INDEPENDENT_AMBULATORY_CARE_PROVIDER_SITE_OTHER): Payer: BC Managed Care – PPO | Admitting: Psychiatry

## 2020-04-29 ENCOUNTER — Encounter: Payer: Self-pay | Admitting: Psychiatry

## 2020-04-29 DIAGNOSIS — F4322 Adjustment disorder with anxiety: Secondary | ICD-10-CM
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
## 2020-05-20 ENCOUNTER — Encounter: Payer: Self-pay | Admitting: Psychiatry

## 2020-05-20 ENCOUNTER — Other Ambulatory Visit: Payer: Self-pay

## 2020-05-20 ENCOUNTER — Ambulatory Visit (INDEPENDENT_AMBULATORY_CARE_PROVIDER_SITE_OTHER): Payer: BC Managed Care – PPO | Admitting: Psychiatry

## 2020-05-20 DIAGNOSIS — F4322 Adjustment disorder with anxiety: Secondary | ICD-10-CM | POA: Diagnosis not present
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
## 2020-05-21 ENCOUNTER — Ambulatory Visit: Payer: BC Managed Care – PPO | Admitting: Gastroenterology

## 2020-05-24 ENCOUNTER — Other Ambulatory Visit: Payer: Self-pay | Admitting: Gastroenterology

## 2020-06-04 ENCOUNTER — Other Ambulatory Visit: Payer: Self-pay

## 2020-06-04 DIAGNOSIS — K769 Liver disease, unspecified: Secondary | ICD-10-CM

## 2020-06-07 ENCOUNTER — Other Ambulatory Visit: Payer: Self-pay

## 2020-06-07 ENCOUNTER — Encounter: Payer: Self-pay | Admitting: Psychiatry

## 2020-06-07 ENCOUNTER — Ambulatory Visit (INDEPENDENT_AMBULATORY_CARE_PROVIDER_SITE_OTHER): Payer: BC Managed Care – PPO | Admitting: Psychiatry

## 2020-06-07 DIAGNOSIS — F4322 Adjustment disorder with anxiety: Secondary | ICD-10-CM | POA: Diagnosis not present

## 2020-06-07 NOTE — Progress Notes (Signed)
      [facility] Counselor/Therapist Progress Note  Patient ID: LORELEE MCLAURIN, MRN: [id number],    Date: 06/07/2020  Time Spent: 50 minutes   Treatment Type: Individual Therapy  Reported Symptoms: anxiety  Mental Status Exam:  Appearance:   Casual and Well Groomed     Behavior:  Appropriate  Motor:  Normal  Speech/Language:   Clear and Coherent  Affect:  Appropriate  Mood:  anxious  Thought process:  normal  Thought content:    WNL  Sensory/Perceptual disturbances:    WNL  Orientation:  oriented to person, place, time/date and situation  Attention:  Good  Concentration:  Good  Memory:  WNL  Fund of knowledge:   Good  Insight:    Good  Judgment:   Good  Impulse Control:  Good   Risk Assessment: Danger to Self:  No Self-injurious Behavior: No Danger to Others: No Duty to Warn:no Physical Aggression / Violence:No  Access to Firearms a concern: No  Gang Involvement:No   Subjective: 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  "My current practice has issues."  The client had a new patient that always came in very dysregulated.  The client felt that the patient had just been dumped on her and her supervisor was not very supportive in helping her navigate a treatment protocol.  I used eye-movement with the client focusing on the conflict at her office.  Her negative cognition was, "am I doing something wrong?"  She felt anxiety in her chest.  Her subjective units of distress was a 5+.  During their supervision times the client felt that the supervisor insulted her by implying that she could not handle challenging cases or was not up to the task.  "It felt so toxic."  The office manager for her practice recently resigned due to the same issues.  We discussed setting some boundaries such as transferring the patient to one of the female therapists that is indicated he would be willing to take the patient on.  Also to keep a low profile in her office.  If the client is  offered the other position and she feels like it is a good fit to make the transition as easily and gently as possible.  The client agreed.  Her positive cognition at the end of the session was, "I can trust my judgment."  Her subjective units of distress was less than 2. The client has had some difficulty with a close friend.  The friend had not really been responding to her text messages.  The client asked directly if she was mad at the client?  There was no response.  The client has decided at that point that she has done everything she can.  I supported the client in that position.  I explained that the ball is in her friend's court.  The client has done which she can.  The client agreed.  Interventions: Assertiveness/Communication, Motivational Interviewing, Solution-Oriented/Positive Psychology, CIT Group Desensitization and Reprocessing (EMDR) and Insight-Oriented  Diagnosis:   ICD-10-CM   1. Adjustment disorder with anxiety  F43.22     Plan: Boundaries, assertiveness, mood independent behavior, positive self talk, deflection, self-care.  Qunicy Higinbotham, [facility]

## 2020-06-21 ENCOUNTER — Other Ambulatory Visit: Payer: Self-pay

## 2020-06-21 ENCOUNTER — Ambulatory Visit (INDEPENDENT_AMBULATORY_CARE_PROVIDER_SITE_OTHER): Payer: BC Managed Care – PPO | Admitting: Psychiatry

## 2020-06-21 ENCOUNTER — Encounter: Payer: Self-pay | Admitting: Psychiatry

## 2020-06-21 DIAGNOSIS — F4322 Adjustment disorder with anxiety: Secondary | ICD-10-CM
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
## 2020-06-24 ENCOUNTER — Other Ambulatory Visit: Payer: Self-pay

## 2020-06-24 ENCOUNTER — Encounter: Payer: Self-pay | Admitting: Gastroenterology

## 2020-06-24 ENCOUNTER — Ambulatory Visit: Payer: BC Managed Care – PPO | Admitting: Gastroenterology

## 2020-06-24 VITALS — BP 102/72 | HR 90 | Ht 62.0 in | Wt 128.5 lb

## 2020-06-24 DIAGNOSIS — R1011 Right upper quadrant pain: Secondary | ICD-10-CM

## 2020-06-24 DIAGNOSIS — R932 Abnormal findings on diagnostic imaging of liver and biliary tract: Secondary | ICD-10-CM | POA: Diagnosis not present

## 2020-06-24 DIAGNOSIS — R109 Unspecified abdominal pain: Secondary | ICD-10-CM | POA: Diagnosis not present

## 2020-06-24 DIAGNOSIS — R1084 Generalized abdominal pain: Secondary | ICD-10-CM | POA: Diagnosis not present

## 2020-06-24 MED ORDER — LANSOPRAZOLE 30 MG PO CPDR: 30.0000 mg | DELAYED_RELEASE_CAPSULE | Freq: Two times a day (BID) | ORAL | 3 refills | Status: DC

## 2020-06-24 NOTE — Progress Notes (Signed)
Referring Provider: Robyne Peers, MD Primary Care Physician:  Robyne Peers, MD  Chief complaint:  Pain and abnormal CT scan   IMPRESSION:  - RUQ/Right flank pain not explained by ultrasound, CT, EGD, or colonoscopy - Abnormal liver on CT scan: 1.5 cm lesion seen in the liver - Moderate amount of stool in the colon by CT- Diverticulosis - No known family history of colon cancer or polyps  RUQ/Right flank pain occurring in the setting of meloxicam use. Not explained by ultrasound, CT, EGD, or colonoscopy.  Proceed with HIDA. Continue PPI therapy, avoid all NSAIDs in the meantime.   Abnormal liver on CT scan and MRI: 1.6 cm lesion seen in the liver. Not the cause of her pain. Follow-up MRI recommended for clarification of the lesion and to monitor stability.   PLAN: - Continue Lanzoprazole 30 mg BID - Avoid all NSAIDs - HIDA scan - Obtain records from prior GI and ENT - MRI of the liver June 2021:  1.6 cm lesion follow-up  Please see the "Patient Instructions" section for addition details about the plan.  HPI: Gina Bauer is a 50 y.o. female last seen in the office 02/23/20 for side pain. Returns in follow-up after endoscopic evaluation 04/11/20. The interval  history is obtained through the patient, her husband who accompanies her to this appointment. and review of her electronic health record.  Previously seen by gastroenterologist in [facility] 10 years ago for right sided abdominal pain. She has a history of anemia, anxiety, pneumonia.   RUQ/Right flank/back pain. Initially started while on vacation, celebrating her 49th birthday, and drinking more alcohol than normal.  Right abdominal pain improved after 2 doses of prednisone for probable right musculoskeletal pain at the lower rib cage, and resolved within one month. Previously using melaxicam. No change in bowel habits or blood in the stool.  Evaluated by ENT for a lump in her throat a few years ago that was ultimately  attributed to reflux. Started using lansoprazole 15 mg PRN at that time.   She feels like symptoms could be food related. Does not eat any dairy to minimize bloating. Tries to eat healthy.  No other special diets.   Recent evaluation:  - Ultrasound normal 12/07/19 - Ca125 and CEA were normal - Labs 12/25/19: normal CMP, lipase, CBC - Abdominal x-ray 12/25/19: normal bowel gas pattern - CT abd/pelvis 12/25/19: globular uterine, moderate stool in the colon, 1.5 cm low density lesion in the posterior aspect of the right hepatic lobe - Transvaginal ultrasound 12/28/19: no abnormalities - HIDA scan was recommended but then cancelled - MRI 03/04/20: multi-septated 1.6cm lesion in the right hepatic lobe, sinus cyst of the right kidney - EGD 04/11/20 showed reflux and gastropathy. Duodenal biopsies were negative. Lansoprazole increased to 30 mg BID.  - Colonoscopy 04/11/20 showed diverticulosis in the sigmoid, descending colon, and ascending colon and internal hemorrhoids. No polyps. Surveillance colonoscopy recommended in 10 years.   Right sided abdominal pain started again last month and lasted for 2 weeks.   Drinking aloe juice and licorice root.  Does not appear to be related to dairy. Not triggered by red meat.  Not worsened by [facility]. Tito triggered her symptoms. No trouble with KetelOne. No other identified triggers. No alarm features.     Past Medical History:  Diagnosis Date  . Anemia   . Anxiety   . Bladder disorder     Past Surgical History:  Procedure Laterality Date  . DENTAL  SURGERY    . MOUTH SURGERY    . WISDOM TOOTH EXTRACTION      Current Outpatient Medications  Medication Sig Dispense Refill  . norgestimate-ethinyl estradiol (ESTARYLLA) 0.25-35 MG-MCG tablet TAKE 1 ACTIVE TABLET DAILY, SKIPPING PLACEBO TABLET 112 tablet 3  . lansoprazole (PREVACID) 30 MG capsule Take 1 capsule (30 mg total) by mouth 2 (two) times daily before a meal. 180 capsule 3   No current  facility-administered medications for this visit.    Allergies as of 06/24/2020  . (No Known Allergies)    Family History  Problem Relation Age of Onset  . Breast cancer Mother   . Diabetes Mother   . Heart disease Mother   . Heart failure Mother   . Colon polyps Father   . Prostate cancer Father   . Alzheimer's disease Father   . Colon cancer Neg Hx   . Stomach cancer Neg Hx   . Esophageal cancer Neg Hx   . Pancreatic cancer Neg Hx     Physical Exam: General:   Alert,  well-nourished, pleasant and cooperative in NAD Head:  Normocephalic and atraumatic. Eyes:  Sclera clear, no icterus.   Conjunctiva pink. Abdomen:  Soft, nontender, nondistended, normal bowel sounds, no rebound or guarding. No hepatosplenomegaly.   Neurologic:  Alert and  oriented x4;  grossly nonfocal Skin:  Intact without significant lesions or rashes. Psych:  Alert and cooperative. Normal mood and affect.     Dimitrios Balestrieri L. Tarri Glenn, MD, MPH 06/30/2020, 9:43 PM

## 2020-06-24 NOTE — Patient Instructions (Addendum)
It was a pleasure to see you today. Based on our discussion, I am providing you with my recommendations below:  RECOMMENDATION(S):    I did not make any changes to your medication regimen today. Although, at your request, a prescription has been sent to [facility]   To better evaluate your symptoms, I would like to order a HIDA scan   I would like to evaluate the liver lesion with an MRI of your abdomen  IMAGING:  . You will be contacted by [facility] [place] (Your caller ID will indicate phone # [phone]) within the next business 2 days to schedule your HIDA SCAN and MRI ABDOMEN. If you have not heard from them within 2 business days, please call [facility] at [phone] to follow up on the status of your appointment.    We will obtain your previous records from ENT and GI. Although, since you are unable to recall the names of these providers, you have requested to do additional research to inquire further. I am providing you with our fax # [phone] to request these providers forward your records and to ensure these records are received.  BMI:  . If you are age 2 or younger, your body mass index should be between 19-25. Your There is no height or weight on file to calculate BMI. If this is out of the aformentioned range listed, please consider follow up with your Primary Care Provider.   Thank you for trusting me with your gastrointestinal care!    Thornton Park, MD, MPH

## 2020-06-30 ENCOUNTER — Encounter: Payer: Self-pay | Admitting: Gastroenterology

## 2020-07-02 NOTE — Progress Notes (Signed)
Appointment Information  Name: Gina Bauer, Gina Bauer MRN: [id number]  Date: 09/13/2020 Status: Sch  Time: 9:00 AM Length: 60  Visit Type: MR Patrick [[id number]] Copay: $0.00  Provider: [facility] Department: [facility]  Referring Provider: Thornton Park CSN: [id number]  Notes: s/w patient. arrive at mc at 830am.   Made On: 07/01/2020 4:44 PM By: Cathlean Cower   Appointment Information  Name: Gina Bauer, Gina Bauer MRN: [id number]  Date: 09/13/2020 Status: Sch  Time: 11:00 AM Length: 180  Visit Type: NM HEPATO W/ EF [[id number]] Copay: $0.00  Provider: Darral Dash 2 Department: [facility] MEDICINE  Referring Provider: Thornton Park CSN: [id number]  Notes: arrive at mc at 1030am. nppo 6 hrs. s/w patient.   Made On: 07/01/2020 4:44 PM By: Cathlean Cower

## 2020-07-04 ENCOUNTER — Other Ambulatory Visit: Payer: Self-pay

## 2020-07-04 ENCOUNTER — Ambulatory Visit (INDEPENDENT_AMBULATORY_CARE_PROVIDER_SITE_OTHER): Payer: BC Managed Care – PPO | Admitting: Psychiatry

## 2020-07-04 ENCOUNTER — Encounter: Payer: Self-pay | Admitting: Psychiatry

## 2020-07-04 DIAGNOSIS — F4322 Adjustment disorder with anxiety: Secondary | ICD-10-CM

## 2020-07-04 NOTE — Progress Notes (Signed)
      [facility] Counselor/Therapist Progress Note  Patient ID: Gina Bauer, MRN: [id number],    Date: 07/04/2020  Time Spent: 45 minutes   Treatment Type: Individual Therapy  Reported Symptoms: anxiety  Mental Status Exam:  Appearance:   Well Groomed     Behavior:  Appropriate  Motor:  Normal  Speech/Language:   Clear and Coherent  Affect:  Appropriate  Mood:  anxious  Thought process:  normal  Thought content:    WNL  Sensory/Perceptual disturbances:    WNL  Orientation:  oriented to person, place, time/date and situation  Attention:  Good  Concentration:  Good  Memory:  WNL  Fund of knowledge:   Good  Insight:    Good  Judgment:   Good  Impulse Control:  Good   Risk Assessment: Danger to Self:  No Self-injurious Behavior: No Danger to Others: No Duty to Warn:no Physical Aggression / Violence:No  Access to Firearms a concern: No  Gang Involvement:No   Subjective: The client has transferred to the new practice.  She is working towards getting all the paperwork done so she can get begin.  The client states that the issues with her local [place] are no longer an issue for her.  She realizes that she was involved in catastrophic thinking.  Going forward she is worried about who she will see when I have retired.  We discussed Jacqualine Mau, [facility] in [place].  The client has his contact information along with a handout of other therapists.  She stated she will contact him to set up an appointment. The client states she is always waiting for the other shoe to drop.  She states that everything is going very well in her life right now.  We discussed keeping her thinking on track and staying in the present tense through mindfulness.  The client agrees that she needs to do this.  She will continue to practice that going forward.  Interventions: Assertiveness/Communication, Mindfulness Meditation, Motivational Interviewing, Solution-Oriented/Positive Psychology and  Insight-Oriented  Diagnosis:   ICD-10-CM   1. Adjustment disorder with anxiety  F43.22     Plan: Mood independent behavior, mindfulness, positive self talk, self-care, exercise, assertiveness, boundaries.  Joletta Manner, [facility]

## 2020-07-18 ENCOUNTER — Encounter: Payer: Self-pay | Admitting: Psychiatry

## 2020-07-18 ENCOUNTER — Ambulatory Visit (INDEPENDENT_AMBULATORY_CARE_PROVIDER_SITE_OTHER): Payer: BC Managed Care – PPO | Admitting: Psychiatry

## 2020-07-18 ENCOUNTER — Other Ambulatory Visit: Payer: Self-pay

## 2020-07-18 DIAGNOSIS — F4322 Adjustment disorder with anxiety: Secondary | ICD-10-CM

## 2020-07-18 NOTE — Progress Notes (Signed)
      [facility] Counselor/Therapist Progress Note  Patient ID: Gina Bauer, MRN: [id number],    Date: 07/18/2020  Time Spent: 45 minutes   Treatment Type: Individual Therapy  Reported Symptoms: anxiety  Mental Status Exam:  Appearance:   Well Groomed     Behavior:  Appropriate  Motor:  Normal  Speech/Language:   Clear and Coherent  Affect:  Appropriate  Mood:  anxious  Thought process:  normal  Thought content:    WNL  Sensory/Perceptual disturbances:    WNL  Orientation:  oriented to person, place, time/date and situation  Attention:  Good  Concentration:  Good  Memory:  WNL  Fund of knowledge:   Good  Insight:    Good  Judgment:   Good  Impulse Control:  Good   Risk Assessment: Danger to Self:  No Self-injurious Behavior: No Danger to Others: No Duty to Warn:no Physical Aggression / Violence:No  Access to Firearms a concern: No  Gang Involvement:No   Subjective: The client states that she is starting in her new group practice next week.  She had just finished her last week at the other practice.  She is trying to get all her organizational details in place.  She notes that she has some anxiety related to that.  At home her youngest son is having a lot of dental anxiety which the client also suffers with.  She is trying to brainstorm with her dentist the best way of approaching her son.  Her middle son had a bakery practicum that he had not been able to complete.  He was so overwhelmed he decided not to go to class.  The client encouraged her son to go to the class anyway.  He did and was able to get partial credit for the practicum.  He thinks the client later for pushing him to do so. 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  We discussed the fact that I would be retiring at the end of this week.  The client agrees that she [name] take a break for a period of time.  She was grateful for the help that she has received through counseling.  Services  ended by mutual consent.  Interventions: Motivational Interviewing, Solution-Oriented/Positive Psychology and Insight-Oriented  Diagnosis:   ICD-10-CM   1. Adjustment disorder with anxiety  F43.22     Plan: Self-care, positive self talk, mood independent behavior, exercise, radical acceptance.  Sherina Stammer, [facility]

## 2020-08-01 ENCOUNTER — Ambulatory Visit: Payer: BC Managed Care – PPO | Admitting: Psychiatry

## 2020-08-15 ENCOUNTER — Ambulatory Visit: Payer: BC Managed Care – PPO | Admitting: Psychiatry

## 2020-09-13 ENCOUNTER — Ambulatory Visit (HOSPITAL_COMMUNITY)
Admission: RE | Admit: 2020-09-13 | Discharge: 2020-09-13 | Disposition: A | Discharge status: Home / Self Care | Payer: BC Managed Care – PPO | Source: Ambulatory Visit | Attending: Gastroenterology | Admitting: Gastroenterology

## 2020-09-13 ENCOUNTER — Encounter (HOSPITAL_COMMUNITY)
Admission: RE | Admit: 2020-09-13 | Discharge: 2020-09-13 | Disposition: A | Discharge status: Home / Self Care | Payer: BC Managed Care – PPO | Source: Ambulatory Visit | Attending: Gastroenterology | Admitting: Gastroenterology

## 2020-09-13 ENCOUNTER — Other Ambulatory Visit: Payer: Self-pay

## 2020-09-13 DIAGNOSIS — R932 Abnormal findings on diagnostic imaging of liver and biliary tract: Secondary | ICD-10-CM | POA: Insufficient documentation

## 2020-09-13 DIAGNOSIS — R1011 Right upper quadrant pain: Secondary | ICD-10-CM | POA: Diagnosis not present

## 2020-09-13 DIAGNOSIS — R1084 Generalized abdominal pain: Secondary | ICD-10-CM | POA: Insufficient documentation

## 2020-09-13 DIAGNOSIS — R109 Unspecified abdominal pain: Secondary | ICD-10-CM | POA: Diagnosis present

## 2020-09-13 MED ORDER — GADOBUTROL 1 MMOL/ML IV SOLN
6.0000 mL | Freq: Once | INTRAVENOUS | Status: AC | PRN
  Administered 2020-09-13: 6 mL via INTRAVENOUS

## 2020-09-13 MED ORDER — TECHNETIUM TC 99M MEBROFENIN IV KIT: 5.5000 | PACK | Freq: Once | INTRAVENOUS | Status: DC | PRN

## 2020-09-18 ENCOUNTER — Encounter: Payer: Self-pay | Admitting: Obstetrics & Gynecology

## 2020-09-18 ENCOUNTER — Ambulatory Visit (INDEPENDENT_AMBULATORY_CARE_PROVIDER_SITE_OTHER): Payer: BC Managed Care – PPO | Admitting: Obstetrics & Gynecology

## 2020-09-18 ENCOUNTER — Other Ambulatory Visit: Payer: Self-pay

## 2020-09-18 VITALS — BP 116/74 | Ht 61.5 in | Wt 125.0 lb

## 2020-09-18 DIAGNOSIS — N920 Excessive and frequent menstruation with regular cycle: Secondary | ICD-10-CM | POA: Diagnosis not present

## 2020-09-18 DIAGNOSIS — Z9189 Other specified personal risk factors, not elsewhere classified: Secondary | ICD-10-CM | POA: Diagnosis not present

## 2020-09-18 DIAGNOSIS — Z01419 Encounter for gynecological examination (general) (routine) without abnormal findings: Secondary | ICD-10-CM

## 2020-09-18 MED ORDER — NORGESTIMATE-ETH ESTRADIOL 0.25-35 MG-MCG PO TABS: ORAL_TABLET | ORAL | 4 refills | Status: DC

## 2020-09-18 NOTE — Progress Notes (Signed)
Gina Bauer 12/12/1970 [id number]   History:    50 y.o. [id number] Married.  Vasectomy.  Finished with Psychotherapy degree.  3 sons doing well.   RP:  Established patient presenting for annual gyn exam   HPI: Much better on Sprintec with decreased menstrual flow and controlling H/As.  No BTB.  No pelvic pain.  Rt upper quadrant intermittent pain with negative investigation.  No pain with IC.  Normal secretions.  Urine/BMs normal.  Breasts normal.  BMI 23.24.  Good fitness.  Healthy nutrition.  Health labs with Fam MD.  Harriet Masson 2022.    Past medical history,surgical history, family history and social history were all reviewed and documented in the EPIC chart.  Gynecologic History Patient's last menstrual period was 09/11/2020.  Obstetric History OB History  Gravida Para Term Preterm AB Living  6 3       3   SAB IAB Ectopic Multiple Live Births               # Outcome Date GA Lbr Len/2nd Weight Sex Delivery Anes PTL Lv  6 Gravida           5 Gravida           4 Gravida           3 Para           2 Para           1 Para              ROS: A ROS was performed and pertinent positives and negatives are included in the history.  GENERAL: No fevers or chills. HEENT: No change in vision, no earache, sore throat or sinus congestion. NECK: No pain or stiffness. CARDIOVASCULAR: No chest pain or pressure. No palpitations. PULMONARY: No shortness of breath, cough or wheeze. GASTROINTESTINAL: No abdominal pain, nausea, vomiting or diarrhea, melena or bright red blood per rectum. GENITOURINARY: No urinary frequency, urgency, hesitancy or dysuria. MUSCULOSKELETAL: No joint or muscle pain, no back pain, no recent trauma. DERMATOLOGIC: No rash, no itching, no lesions. ENDOCRINE: No polyuria, polydipsia, no heat or cold intolerance. No recent change in weight. HEMATOLOGICAL: No anemia or easy bruising or bleeding. NEUROLOGIC: No headache, seizures, numbness, tingling or weakness. PSYCHIATRIC: No  depression, no loss of interest in normal activity or change in sleep pattern.     Exam:   BP 116/74   Ht 5' 1.5" (1.562 m)   Wt 125 lb (56.7 kg)   LMP 09/11/2020   BMI 23.24 kg/m   Body mass index is 23.24 kg/m.  General appearance : Well developed well nourished female. No acute distress HEENT: Eyes: no retinal hemorrhage or exudates,  Neck supple, trachea midline, no carotid bruits, no thyroidmegaly Lungs: Clear to auscultation, no rhonchi or wheezes, or rib retractions  Heart: Regular rate and rhythm, no murmurs or gallops Breast:Examined in sitting and supine position were symmetrical in appearance, no palpable masses or tenderness,  no skin retraction, no nipple inversion, no nipple discharge, no skin discoloration, no axillary or supraclavicular lymphadenopathy Abdomen: no palpable masses or tenderness, no rebound or guarding Extremities: no edema or skin discoloration or tenderness  Pelvic: Vulva: Normal             Vagina: No gross lesions or discharge  Cervix: No gross lesions or discharge  Uterus  AV, normal size, shape and consistency, non-tender and mobile  Adnexa  Without masses or tenderness  Anus: Normal   Assessment/Plan:  50 y.o. female for annual exam   1. Well female exam with routine gynecological exam Normal gynecologic exam.  Pap test negative in 2020, will repeat at 2 years next year.  Breast exam normal.  Screening mammogram January 2022 was negative.  Colonoscopy 2022.  Health labs with family physician.  Good body mass index at 23.24.  Continue with fitness and healthy nutrition.  2. Relies on partner vasectomy for contraception  3. Menorrhagia with regular cycle Menstrual flow light on Sprintec.  No contraindication to continue.  Prescription sent to pharmacy.  Other orders - norgestimate-ethinyl estradiol (ESTARYLLA) 0.25-35 MG-MCG tablet; TAKE 1 ACTIVE TABLET DAILY, SKIPPING PLACEBO TABLET   [name] MD, 8:33 AM 09/18/2020

## 2020-10-29 ENCOUNTER — Other Ambulatory Visit: Payer: Self-pay | Admitting: Obstetrics & Gynecology

## 2020-10-29 MED ORDER — NORGESTIMATE-ETH ESTRADIOL 0.25-35 MG-MCG PO TABS: ORAL_TABLET | ORAL | 3 refills | Status: DC

## 2020-12-26 DIAGNOSIS — X32XXXS Exposure to sunlight, sequela: Secondary | ICD-10-CM | POA: Diagnosis not present

## 2020-12-26 DIAGNOSIS — L814 Other melanin hyperpigmentation: Secondary | ICD-10-CM | POA: Diagnosis not present

## 2020-12-26 DIAGNOSIS — L821 Other seborrheic keratosis: Secondary | ICD-10-CM | POA: Diagnosis not present

## 2020-12-26 DIAGNOSIS — M71342 Other bursal cyst, left hand: Secondary | ICD-10-CM | POA: Diagnosis not present

## 2020-12-26 DIAGNOSIS — L720 Epidermal cyst: Secondary | ICD-10-CM | POA: Diagnosis not present

## 2021-01-04 DIAGNOSIS — Z23 Encounter for immunization: Secondary | ICD-10-CM | POA: Diagnosis not present

## 2021-02-20 DIAGNOSIS — N926 Irregular menstruation, unspecified: Secondary | ICD-10-CM | POA: Diagnosis not present

## 2021-02-20 DIAGNOSIS — E559 Vitamin D deficiency, unspecified: Secondary | ICD-10-CM | POA: Diagnosis not present

## 2021-02-20 DIAGNOSIS — E538 Deficiency of other specified B group vitamins: Secondary | ICD-10-CM | POA: Diagnosis not present

## 2021-02-20 DIAGNOSIS — R5383 Other fatigue: Secondary | ICD-10-CM | POA: Diagnosis not present

## 2021-02-20 DIAGNOSIS — Z7409 Other reduced mobility: Secondary | ICD-10-CM | POA: Diagnosis not present

## 2021-02-20 DIAGNOSIS — R790 Abnormal level of blood mineral: Secondary | ICD-10-CM | POA: Diagnosis not present

## 2021-02-20 DIAGNOSIS — R7989 Other specified abnormal findings of blood chemistry: Secondary | ICD-10-CM | POA: Diagnosis not present

## 2021-03-27 DIAGNOSIS — R7989 Other specified abnormal findings of blood chemistry: Secondary | ICD-10-CM | POA: Diagnosis not present

## 2021-03-27 DIAGNOSIS — R790 Abnormal level of blood mineral: Secondary | ICD-10-CM | POA: Diagnosis not present

## 2021-03-27 DIAGNOSIS — R5383 Other fatigue: Secondary | ICD-10-CM | POA: Diagnosis not present

## 2021-03-27 DIAGNOSIS — E538 Deficiency of other specified B group vitamins: Secondary | ICD-10-CM | POA: Diagnosis not present

## 2021-04-01 DIAGNOSIS — Z1231 Encounter for screening mammogram for malignant neoplasm of breast: Secondary | ICD-10-CM | POA: Diagnosis not present

## 2021-04-04 ENCOUNTER — Encounter: Payer: Self-pay | Admitting: Obstetrics & Gynecology

## 2021-04-09 DIAGNOSIS — M5137 Other intervertebral disc degeneration, lumbosacral region: Secondary | ICD-10-CM | POA: Diagnosis not present

## 2021-04-09 DIAGNOSIS — M9904 Segmental and somatic dysfunction of sacral region: Secondary | ICD-10-CM | POA: Diagnosis not present

## 2021-04-09 DIAGNOSIS — M9903 Segmental and somatic dysfunction of lumbar region: Secondary | ICD-10-CM | POA: Diagnosis not present

## 2021-04-09 DIAGNOSIS — M5136 Other intervertebral disc degeneration, lumbar region: Secondary | ICD-10-CM | POA: Diagnosis not present

## 2021-05-13 DIAGNOSIS — R350 Frequency of micturition: Secondary | ICD-10-CM | POA: Diagnosis not present

## 2021-05-13 DIAGNOSIS — N390 Urinary tract infection, site not specified: Secondary | ICD-10-CM | POA: Diagnosis not present

## 2021-05-16 DIAGNOSIS — M9904 Segmental and somatic dysfunction of sacral region: Secondary | ICD-10-CM | POA: Diagnosis not present

## 2021-05-16 DIAGNOSIS — M5137 Other intervertebral disc degeneration, lumbosacral region: Secondary | ICD-10-CM | POA: Diagnosis not present

## 2021-05-16 DIAGNOSIS — M9903 Segmental and somatic dysfunction of lumbar region: Secondary | ICD-10-CM | POA: Diagnosis not present

## 2021-05-16 DIAGNOSIS — M5136 Other intervertebral disc degeneration, lumbar region: Secondary | ICD-10-CM | POA: Diagnosis not present

## 2021-05-27 DIAGNOSIS — M5137 Other intervertebral disc degeneration, lumbosacral region: Secondary | ICD-10-CM | POA: Diagnosis not present

## 2021-05-27 DIAGNOSIS — M5136 Other intervertebral disc degeneration, lumbar region: Secondary | ICD-10-CM | POA: Diagnosis not present

## 2021-05-27 DIAGNOSIS — M9904 Segmental and somatic dysfunction of sacral region: Secondary | ICD-10-CM | POA: Diagnosis not present

## 2021-05-27 DIAGNOSIS — M9903 Segmental and somatic dysfunction of lumbar region: Secondary | ICD-10-CM | POA: Diagnosis not present

## 2021-05-30 DIAGNOSIS — M9903 Segmental and somatic dysfunction of lumbar region: Secondary | ICD-10-CM | POA: Diagnosis not present

## 2021-05-30 DIAGNOSIS — M5136 Other intervertebral disc degeneration, lumbar region: Secondary | ICD-10-CM | POA: Diagnosis not present

## 2021-05-30 DIAGNOSIS — M9904 Segmental and somatic dysfunction of sacral region: Secondary | ICD-10-CM | POA: Diagnosis not present

## 2021-05-30 DIAGNOSIS — M5137 Other intervertebral disc degeneration, lumbosacral region: Secondary | ICD-10-CM | POA: Diagnosis not present

## 2021-06-06 DIAGNOSIS — R102 Pelvic and perineal pain: Secondary | ICD-10-CM | POA: Diagnosis not present

## 2021-06-06 DIAGNOSIS — M5137 Other intervertebral disc degeneration, lumbosacral region: Secondary | ICD-10-CM | POA: Diagnosis not present

## 2021-06-06 DIAGNOSIS — N3941 Urge incontinence: Secondary | ICD-10-CM | POA: Diagnosis not present

## 2021-06-06 DIAGNOSIS — N951 Menopausal and female climacteric states: Secondary | ICD-10-CM | POA: Diagnosis not present

## 2021-06-06 DIAGNOSIS — M5136 Other intervertebral disc degeneration, lumbar region: Secondary | ICD-10-CM | POA: Diagnosis not present

## 2021-06-06 DIAGNOSIS — M9903 Segmental and somatic dysfunction of lumbar region: Secondary | ICD-10-CM | POA: Diagnosis not present

## 2021-06-06 DIAGNOSIS — R3129 Other microscopic hematuria: Secondary | ICD-10-CM | POA: Diagnosis not present

## 2021-06-06 DIAGNOSIS — M9904 Segmental and somatic dysfunction of sacral region: Secondary | ICD-10-CM | POA: Diagnosis not present

## 2021-06-12 DIAGNOSIS — R350 Frequency of micturition: Secondary | ICD-10-CM | POA: Diagnosis not present

## 2021-06-12 DIAGNOSIS — R800 Isolated proteinuria: Secondary | ICD-10-CM | POA: Diagnosis not present

## 2021-06-12 DIAGNOSIS — R102 Pelvic and perineal pain: Secondary | ICD-10-CM | POA: Diagnosis not present

## 2021-06-12 DIAGNOSIS — R310 Gross hematuria: Secondary | ICD-10-CM | POA: Diagnosis not present

## 2021-06-13 DIAGNOSIS — M9904 Segmental and somatic dysfunction of sacral region: Secondary | ICD-10-CM | POA: Diagnosis not present

## 2021-06-13 DIAGNOSIS — M9903 Segmental and somatic dysfunction of lumbar region: Secondary | ICD-10-CM | POA: Diagnosis not present

## 2021-06-13 DIAGNOSIS — M5137 Other intervertebral disc degeneration, lumbosacral region: Secondary | ICD-10-CM | POA: Diagnosis not present

## 2021-06-13 DIAGNOSIS — M5136 Other intervertebral disc degeneration, lumbar region: Secondary | ICD-10-CM | POA: Diagnosis not present

## 2021-06-20 DIAGNOSIS — M9904 Segmental and somatic dysfunction of sacral region: Secondary | ICD-10-CM | POA: Diagnosis not present

## 2021-06-20 DIAGNOSIS — M9903 Segmental and somatic dysfunction of lumbar region: Secondary | ICD-10-CM | POA: Diagnosis not present

## 2021-06-20 DIAGNOSIS — M5137 Other intervertebral disc degeneration, lumbosacral region: Secondary | ICD-10-CM | POA: Diagnosis not present

## 2021-06-20 DIAGNOSIS — M5136 Other intervertebral disc degeneration, lumbar region: Secondary | ICD-10-CM | POA: Diagnosis not present

## 2021-07-10 DIAGNOSIS — R800 Isolated proteinuria: Secondary | ICD-10-CM | POA: Diagnosis not present

## 2021-07-10 DIAGNOSIS — R350 Frequency of micturition: Secondary | ICD-10-CM | POA: Diagnosis not present

## 2021-07-10 DIAGNOSIS — R3129 Other microscopic hematuria: Secondary | ICD-10-CM | POA: Diagnosis not present

## 2021-07-10 DIAGNOSIS — R310 Gross hematuria: Secondary | ICD-10-CM | POA: Diagnosis not present

## 2021-07-10 DIAGNOSIS — R102 Pelvic and perineal pain: Secondary | ICD-10-CM | POA: Diagnosis not present

## 2021-07-11 DIAGNOSIS — M9903 Segmental and somatic dysfunction of lumbar region: Secondary | ICD-10-CM | POA: Diagnosis not present

## 2021-07-11 DIAGNOSIS — M5137 Other intervertebral disc degeneration, lumbosacral region: Secondary | ICD-10-CM | POA: Diagnosis not present

## 2021-07-11 DIAGNOSIS — M9904 Segmental and somatic dysfunction of sacral region: Secondary | ICD-10-CM | POA: Diagnosis not present

## 2021-07-11 DIAGNOSIS — M5136 Other intervertebral disc degeneration, lumbar region: Secondary | ICD-10-CM | POA: Diagnosis not present

## 2021-07-18 DIAGNOSIS — R3129 Other microscopic hematuria: Secondary | ICD-10-CM | POA: Diagnosis not present

## 2021-07-22 DIAGNOSIS — N281 Cyst of kidney, acquired: Secondary | ICD-10-CM | POA: Diagnosis not present

## 2021-07-22 DIAGNOSIS — R3129 Other microscopic hematuria: Secondary | ICD-10-CM | POA: Diagnosis not present

## 2021-08-01 DIAGNOSIS — M5137 Other intervertebral disc degeneration, lumbosacral region: Secondary | ICD-10-CM | POA: Diagnosis not present

## 2021-08-01 DIAGNOSIS — M5136 Other intervertebral disc degeneration, lumbar region: Secondary | ICD-10-CM | POA: Diagnosis not present

## 2021-08-01 DIAGNOSIS — M9903 Segmental and somatic dysfunction of lumbar region: Secondary | ICD-10-CM | POA: Diagnosis not present

## 2021-08-01 DIAGNOSIS — M9904 Segmental and somatic dysfunction of sacral region: Secondary | ICD-10-CM | POA: Diagnosis not present

## 2021-08-08 DIAGNOSIS — Z131 Encounter for screening for diabetes mellitus: Secondary | ICD-10-CM | POA: Diagnosis not present

## 2021-08-08 DIAGNOSIS — Z7409 Other reduced mobility: Secondary | ICD-10-CM | POA: Diagnosis not present

## 2021-08-08 DIAGNOSIS — E538 Deficiency of other specified B group vitamins: Secondary | ICD-10-CM | POA: Diagnosis not present

## 2021-08-08 DIAGNOSIS — R5383 Other fatigue: Secondary | ICD-10-CM | POA: Diagnosis not present

## 2021-08-08 DIAGNOSIS — N926 Irregular menstruation, unspecified: Secondary | ICD-10-CM | POA: Diagnosis not present

## 2021-08-08 DIAGNOSIS — E559 Vitamin D deficiency, unspecified: Secondary | ICD-10-CM | POA: Diagnosis not present

## 2021-08-08 DIAGNOSIS — E039 Hypothyroidism, unspecified: Secondary | ICD-10-CM | POA: Diagnosis not present

## 2021-08-18 ENCOUNTER — Other Ambulatory Visit: Payer: Self-pay | Admitting: Gastroenterology

## 2021-08-18 DIAGNOSIS — R1011 Right upper quadrant pain: Secondary | ICD-10-CM

## 2021-08-18 DIAGNOSIS — R932 Abnormal findings on diagnostic imaging of liver and biliary tract: Secondary | ICD-10-CM

## 2021-08-18 DIAGNOSIS — R1084 Generalized abdominal pain: Secondary | ICD-10-CM

## 2021-08-18 DIAGNOSIS — R109 Unspecified abdominal pain: Secondary | ICD-10-CM

## 2021-08-21 DIAGNOSIS — R790 Abnormal level of blood mineral: Secondary | ICD-10-CM | POA: Diagnosis not present

## 2021-08-21 DIAGNOSIS — R7989 Other specified abnormal findings of blood chemistry: Secondary | ICD-10-CM | POA: Diagnosis not present

## 2021-08-21 DIAGNOSIS — E039 Hypothyroidism, unspecified: Secondary | ICD-10-CM | POA: Diagnosis not present

## 2021-08-21 DIAGNOSIS — E538 Deficiency of other specified B group vitamins: Secondary | ICD-10-CM | POA: Diagnosis not present

## 2021-08-23 ENCOUNTER — Other Ambulatory Visit: Payer: Self-pay | Admitting: Gastroenterology

## 2021-08-23 DIAGNOSIS — R1011 Right upper quadrant pain: Secondary | ICD-10-CM

## 2021-08-23 DIAGNOSIS — R1084 Generalized abdominal pain: Secondary | ICD-10-CM

## 2021-08-23 DIAGNOSIS — R109 Unspecified abdominal pain: Secondary | ICD-10-CM

## 2021-08-23 DIAGNOSIS — R932 Abnormal findings on diagnostic imaging of liver and biliary tract: Secondary | ICD-10-CM

## 2021-09-04 DIAGNOSIS — R5383 Other fatigue: Secondary | ICD-10-CM | POA: Diagnosis not present

## 2021-09-05 ENCOUNTER — Telehealth: Payer: Self-pay | Admitting: Gastroenterology

## 2021-09-05 DIAGNOSIS — R1084 Generalized abdominal pain: Secondary | ICD-10-CM

## 2021-09-05 DIAGNOSIS — R109 Unspecified abdominal pain: Secondary | ICD-10-CM

## 2021-09-05 DIAGNOSIS — R1011 Right upper quadrant pain: Secondary | ICD-10-CM

## 2021-09-05 DIAGNOSIS — R932 Abnormal findings on diagnostic imaging of liver and biliary tract: Secondary | ICD-10-CM

## 2021-09-05 MED ORDER — LANSOPRAZOLE 30 MG PO CPDR: 30.0000 mg | DELAYED_RELEASE_CAPSULE | Freq: Two times a day (BID) | ORAL | 3 refills | Status: DC

## 2021-09-05 NOTE — Telephone Encounter (Signed)
Inbound call from patient stating she is out of medication Lansoprazole and would like a refill. Please give patient a call back to advise.

## 2021-09-05 NOTE — Telephone Encounter (Signed)
Refilled script

## 2021-09-19 ENCOUNTER — Ambulatory Visit: Payer: BC Managed Care – PPO | Admitting: Obstetrics & Gynecology

## 2021-09-30 ENCOUNTER — Other Ambulatory Visit: Payer: Self-pay | Admitting: Obstetrics & Gynecology

## 2021-09-30 MED ORDER — NORGESTIMATE-ETH ESTRADIOL 0.25-35 MG-MCG PO TABS: ORAL_TABLET | ORAL | 0 refills | Status: DC

## 2021-09-30 NOTE — Telephone Encounter (Signed)
Last annual exam was 08/2020 Scheduled on 10/23/21

## 2021-10-23 ENCOUNTER — Encounter: Payer: Self-pay | Admitting: Obstetrics & Gynecology

## 2021-10-23 ENCOUNTER — Ambulatory Visit (INDEPENDENT_AMBULATORY_CARE_PROVIDER_SITE_OTHER): Payer: BC Managed Care – PPO | Admitting: Obstetrics & Gynecology

## 2021-10-23 ENCOUNTER — Other Ambulatory Visit (HOSPITAL_COMMUNITY)
Admission: RE | Admit: 2021-10-23 | Discharge: 2021-10-23 | Disposition: A | Discharge status: Home / Self Care | Payer: BC Managed Care – PPO | Source: Ambulatory Visit | Attending: Obstetrics & Gynecology | Admitting: Obstetrics & Gynecology

## 2021-10-23 VITALS — BP 114/70 | HR 82 | Ht 61.75 in | Wt 134.0 lb

## 2021-10-23 DIAGNOSIS — Z01419 Encounter for gynecological examination (general) (routine) without abnormal findings: Secondary | ICD-10-CM

## 2021-10-23 DIAGNOSIS — N920 Excessive and frequent menstruation with regular cycle: Secondary | ICD-10-CM | POA: Diagnosis not present

## 2021-10-23 DIAGNOSIS — Z9189 Other specified personal risk factors, not elsewhere classified: Secondary | ICD-10-CM

## 2021-10-23 MED ORDER — NORGESTIMATE-ETH ESTRADIOL 0.25-35 MG-MCG PO TABS: ORAL_TABLET | ORAL | 4 refills | Status: DC

## 2021-10-23 NOTE — Progress Notes (Signed)
Gina Bauer 09/08/1970 [id number]   History:    51 y.o. [id number] Married.  Vasectomy.  Finished with Psychotherapy degree.  3 sons doing well.   RP:  Established patient presenting for annual gyn exam   HPI: Much better on Sprintec with decreased menstrual flow and controlling H/As.  No BTB. No pelvic pain.  No pain with IC.  Normal secretions.  No h/o abnormal Pap.  Pap Neg 08/2018.  Pap reflex today. BMs normal.  Seen by UroGyn for bladder discomfort/micro hematuria. Cystoscopy Negative.  No Dx of IC, but managing bladder discomfort by certain food avoidance. Breasts normal.  Mammo Neg 03/2021.  BMI 24.71.  Good fitness.  Healthy nutrition.  Health labs with Fam MD.  Harriet Masson 03/2020.   Past medical history,surgical history, family history and social history were all reviewed and documented in the EPIC chart.      Gynecologic History Patient's last menstrual period was 10/14/2021 (exact date).  Obstetric History OB History  Gravida Para Term Preterm AB Living  '6 3 3   3 3  '$ SAB IAB Ectopic Multiple Live Births  3            # Outcome Date GA Lbr Len/2nd Weight Sex Delivery Anes PTL Lv  6 SAB           5 SAB           4 SAB           3 Term           2 Term           1 Term              ROS: A ROS was performed and pertinent positives and negatives are included in the history. GENERAL: No fevers or chills. HEENT: No change in vision, no earache, sore throat or sinus congestion. NECK: No pain or stiffness. CARDIOVASCULAR: No chest pain or pressure. No palpitations. PULMONARY: No shortness of breath, cough or wheeze. GASTROINTESTINAL: No abdominal pain, nausea, vomiting or diarrhea, melena or bright red blood per rectum. GENITOURINARY: No urinary frequency, urgency, hesitancy or dysuria. MUSCULOSKELETAL: No joint or muscle pain, no back pain, no recent trauma. DERMATOLOGIC: No rash, no itching, no lesions. ENDOCRINE: No polyuria, polydipsia, no heat or cold intolerance. No recent  change in weight. HEMATOLOGICAL: No anemia or easy bruising or bleeding. NEUROLOGIC: No headache, seizures, numbness, tingling or weakness. PSYCHIATRIC: No depression, no loss of interest in normal activity or change in sleep pattern.     Exam:   BP 114/70   Pulse 82   Ht 5' 1.75" (1.568 m)   Wt 134 lb (60.8 kg)   LMP 10/14/2021 (Exact Date)   SpO2 98%   BMI 24.71 kg/m   Body mass index is 24.71 kg/m.  General appearance : Well developed well nourished female. No acute distress HEENT: Eyes: no retinal hemorrhage or exudates,  Neck supple, trachea midline, no carotid bruits, no thyroidmegaly Lungs: Clear to auscultation, no rhonchi or wheezes, or rib retractions  Heart: Regular rate and rhythm, no murmurs or gallops Breast:Examined in sitting and supine position were symmetrical in appearance, no palpable masses or tenderness,  no skin retraction, no nipple inversion, no nipple discharge, no skin discoloration, no axillary or supraclavicular lymphadenopathy Abdomen: no palpable masses or tenderness, no rebound or guarding Extremities: no edema or skin discoloration or tenderness  Pelvic: Vulva: Normal  Vagina: No gross lesions or discharge  Cervix: No gross lesions or discharge.  Pap reflex done.  Uterus  AV, normal size, shape and consistency, non-tender and mobile  Adnexa  Without masses or tenderness  Anus: Normal   Assessment/Plan:  51 y.o. female for annual exam   1. Encounter for routine gynecological examination with Papanicolaou smear of cervix Much better on Sprintec with decreased menstrual flow and controlling H/As.  No BTB. No pelvic pain.  No pain with IC.  Normal secretions.  No h/o abnormal Pap.  Pap Neg 08/2018.  Pap reflex today. BMs normal.  Seen by UroGyn for bladder discomfort/micro hematuria. Cystoscopy Negative.  No Dx of IC, but managing bladder discomfort by certain food avoidance. Breasts normal.  Mammo Neg 03/2021.  BMI 24.71.  Good fitness.   Healthy nutrition.  Health labs with Fam MD.  Harriet Masson 03/2020. - Cytology - PAP( [place])  2. Relies on partner vasectomy for contraception  3. Menorrhagia with regular cycle Much better on Sprintec with decreased menstrual flow and controlling H/As.  No BTB.  No pelvic pain.  No pain with IC.  No CI to continue on BCPs, prescription sent to pharmacy.  Other orders - NONFORMULARY OR COMPOUNDED ITEM; Testosterone 4% cream - cyanocobalamin 100 MCG tablet; Take by mouth. - omeprazole (PRILOSEC) 20 MG capsule; Take by mouth. - UNABLE TO FIND; Med Name: lowest dose thyroid medication - norgestimate-ethinyl estradiol (ESTARYLLA) 0.25-35 MG-MCG tablet; TAKE 1 ACTIVE TABLET DAILY SKIPPING PLACEBO TABLET   [name] MD, 8:23 AM 10/23/2021

## 2021-10-27 LAB — CYTOLOGY - PAP: Diagnosis: NEGATIVE

## 2021-11-06 DIAGNOSIS — R5383 Other fatigue: Secondary | ICD-10-CM | POA: Diagnosis not present

## 2021-11-06 DIAGNOSIS — E039 Hypothyroidism, unspecified: Secondary | ICD-10-CM | POA: Diagnosis not present

## 2021-11-06 DIAGNOSIS — E559 Vitamin D deficiency, unspecified: Secondary | ICD-10-CM | POA: Diagnosis not present

## 2021-11-06 DIAGNOSIS — R790 Abnormal level of blood mineral: Secondary | ICD-10-CM | POA: Diagnosis not present

## 2021-11-06 DIAGNOSIS — N926 Irregular menstruation, unspecified: Secondary | ICD-10-CM | POA: Diagnosis not present

## 2021-11-06 DIAGNOSIS — E538 Deficiency of other specified B group vitamins: Secondary | ICD-10-CM | POA: Diagnosis not present

## 2021-11-06 DIAGNOSIS — R7989 Other specified abnormal findings of blood chemistry: Secondary | ICD-10-CM | POA: Diagnosis not present

## 2021-12-04 DIAGNOSIS — E039 Hypothyroidism, unspecified: Secondary | ICD-10-CM | POA: Diagnosis not present

## 2021-12-04 DIAGNOSIS — N926 Irregular menstruation, unspecified: Secondary | ICD-10-CM | POA: Diagnosis not present

## 2021-12-04 DIAGNOSIS — B2790 Infectious mononucleosis, unspecified without complication: Secondary | ICD-10-CM | POA: Diagnosis not present

## 2021-12-04 DIAGNOSIS — R5383 Other fatigue: Secondary | ICD-10-CM | POA: Diagnosis not present

## 2021-12-05 DIAGNOSIS — Z23 Encounter for immunization: Secondary | ICD-10-CM | POA: Diagnosis not present

## 2021-12-05 DIAGNOSIS — Z0001 Encounter for general adult medical examination with abnormal findings: Secondary | ICD-10-CM | POA: Diagnosis not present

## 2021-12-05 DIAGNOSIS — Z0000 Encounter for general adult medical examination without abnormal findings: Secondary | ICD-10-CM | POA: Diagnosis not present

## 2021-12-05 DIAGNOSIS — Z130 Encounter for screening for diseases of the blood and blood-forming organs and certain disorders involving the immune mechanism: Secondary | ICD-10-CM | POA: Diagnosis not present

## 2021-12-05 DIAGNOSIS — R7982 Elevated C-reactive protein (CRP): Secondary | ICD-10-CM | POA: Diagnosis not present

## 2021-12-05 DIAGNOSIS — Z13228 Encounter for screening for other metabolic disorders: Secondary | ICD-10-CM | POA: Diagnosis not present

## 2021-12-05 DIAGNOSIS — Z13220 Encounter for screening for lipoid disorders: Secondary | ICD-10-CM | POA: Diagnosis not present

## 2021-12-05 DIAGNOSIS — R899 Unspecified abnormal finding in specimens from other organs, systems and tissues: Secondary | ICD-10-CM | POA: Diagnosis not present

## 2021-12-05 DIAGNOSIS — R5383 Other fatigue: Secondary | ICD-10-CM | POA: Diagnosis not present

## 2021-12-23 ENCOUNTER — Other Ambulatory Visit: Payer: Self-pay | Admitting: Obstetrics & Gynecology

## 2021-12-23 NOTE — Telephone Encounter (Signed)
Pt was prescribed #112 with 4 refills on 10/23/2021 to the [place], [place]. Will send pt msg.

## 2022-01-05 DIAGNOSIS — M5137 Other intervertebral disc degeneration, lumbosacral region: Secondary | ICD-10-CM | POA: Diagnosis not present

## 2022-01-05 DIAGNOSIS — M5136 Other intervertebral disc degeneration, lumbar region: Secondary | ICD-10-CM | POA: Diagnosis not present

## 2022-01-05 DIAGNOSIS — M9903 Segmental and somatic dysfunction of lumbar region: Secondary | ICD-10-CM | POA: Diagnosis not present

## 2022-01-05 DIAGNOSIS — M9904 Segmental and somatic dysfunction of sacral region: Secondary | ICD-10-CM | POA: Diagnosis not present

## 2022-01-05 NOTE — Telephone Encounter (Signed)
LDVMOM per DPR.

## 2022-01-09 DIAGNOSIS — M5136 Other intervertebral disc degeneration, lumbar region: Secondary | ICD-10-CM | POA: Diagnosis not present

## 2022-01-09 DIAGNOSIS — M9903 Segmental and somatic dysfunction of lumbar region: Secondary | ICD-10-CM | POA: Diagnosis not present

## 2022-01-09 DIAGNOSIS — M5137 Other intervertebral disc degeneration, lumbosacral region: Secondary | ICD-10-CM | POA: Diagnosis not present

## 2022-01-09 DIAGNOSIS — M9904 Segmental and somatic dysfunction of sacral region: Secondary | ICD-10-CM | POA: Diagnosis not present

## 2022-01-12 DIAGNOSIS — M531 Cervicobrachial syndrome: Secondary | ICD-10-CM | POA: Diagnosis not present

## 2022-01-12 DIAGNOSIS — L814 Other melanin hyperpigmentation: Secondary | ICD-10-CM | POA: Diagnosis not present

## 2022-01-12 DIAGNOSIS — L821 Other seborrheic keratosis: Secondary | ICD-10-CM | POA: Diagnosis not present

## 2022-01-12 DIAGNOSIS — M9901 Segmental and somatic dysfunction of cervical region: Secondary | ICD-10-CM | POA: Diagnosis not present

## 2022-01-12 DIAGNOSIS — M50322 Other cervical disc degeneration at C5-C6 level: Secondary | ICD-10-CM | POA: Diagnosis not present

## 2022-01-12 DIAGNOSIS — X32XXXS Exposure to sunlight, sequela: Secondary | ICD-10-CM | POA: Diagnosis not present

## 2022-01-12 DIAGNOSIS — L249 Irritant contact dermatitis, unspecified cause: Secondary | ICD-10-CM | POA: Diagnosis not present

## 2022-01-12 DIAGNOSIS — M50323 Other cervical disc degeneration at C6-C7 level: Secondary | ICD-10-CM | POA: Diagnosis not present

## 2022-01-15 DIAGNOSIS — R350 Frequency of micturition: Secondary | ICD-10-CM | POA: Diagnosis not present

## 2022-01-15 DIAGNOSIS — R300 Dysuria: Secondary | ICD-10-CM | POA: Diagnosis not present

## 2022-01-15 DIAGNOSIS — R3989 Other symptoms and signs involving the genitourinary system: Secondary | ICD-10-CM | POA: Diagnosis not present

## 2022-01-15 DIAGNOSIS — R8290 Unspecified abnormal findings in urine: Secondary | ICD-10-CM | POA: Diagnosis not present

## 2022-01-15 DIAGNOSIS — R3129 Other microscopic hematuria: Secondary | ICD-10-CM | POA: Diagnosis not present

## 2022-01-15 DIAGNOSIS — R102 Pelvic and perineal pain: Secondary | ICD-10-CM | POA: Diagnosis not present

## 2022-01-15 DIAGNOSIS — R82998 Other abnormal findings in urine: Secondary | ICD-10-CM | POA: Diagnosis not present

## 2022-01-20 DIAGNOSIS — M531 Cervicobrachial syndrome: Secondary | ICD-10-CM | POA: Diagnosis not present

## 2022-01-20 DIAGNOSIS — M9901 Segmental and somatic dysfunction of cervical region: Secondary | ICD-10-CM | POA: Diagnosis not present

## 2022-01-20 DIAGNOSIS — M50322 Other cervical disc degeneration at C5-C6 level: Secondary | ICD-10-CM | POA: Diagnosis not present

## 2022-01-20 DIAGNOSIS — M50323 Other cervical disc degeneration at C6-C7 level: Secondary | ICD-10-CM | POA: Diagnosis not present

## 2022-01-26 ENCOUNTER — Ambulatory Visit (HOSPITAL_BASED_OUTPATIENT_CLINIC_OR_DEPARTMENT_OTHER): Payer: BC Managed Care – PPO | Admitting: Cardiovascular Disease

## 2022-01-26 ENCOUNTER — Encounter (HOSPITAL_BASED_OUTPATIENT_CLINIC_OR_DEPARTMENT_OTHER): Payer: Self-pay | Admitting: Cardiovascular Disease

## 2022-01-26 VITALS — BP 120/84 | HR 78 | Ht 62.0 in | Wt 140.0 lb

## 2022-01-26 DIAGNOSIS — R7982 Elevated C-reactive protein (CRP): Secondary | ICD-10-CM | POA: Diagnosis not present

## 2022-01-26 HISTORY — DX: Elevated C-reactive protein (CRP): R79.82

## 2022-01-26 NOTE — Patient Instructions (Signed)
Medication Instructions:  Your Physician recommend you continue on your current medication as directed.    *If you need a refill on your cardiac medications before your next appointment, please call your pharmacy*   Lab Work: Your physician recommends that you return for lab work today- Lpa  If you have labs (blood work) drawn today and your tests are completely normal, you will receive your results only by: MyChart Message (if you have MyChart) OR A paper copy in the mail If you have any lab test that is abnormal or we need to change your treatment, we will call you to review the results.   Testing/Procedures: Your physician has recommend you to have a coronary calcium score. This is a self pay test that will cost $99  Follow-Up: At [facility], you and your health needs are our priority.  As part of our continuing mission to provide you with exceptional heart care, we have created designated Provider Care Teams.  These Care Teams include your primary Cardiologist (physician) and Advanced Practice Providers (APPs -  Physician Assistants and Nurse Practitioners) who all work together to provide you with the care you need, when you need it.  We recommend signing up for the patient portal called "MyChart".  Sign up information is provided on this After Visit Summary.  MyChart is used to connect with patients for Virtual Visits (Telemedicine).  Patients are able to view lab/test results, encounter notes, upcoming appointments, etc.  Non-urgent messages can be sent to your provider as well.   To learn more about what you can do with MyChart, go to [email or web address].    Your next appointment:   6 month(s)  The format for your next appointment:   In Person  Provider:   Skeet Latch, MD

## 2022-01-26 NOTE — Progress Notes (Signed)
Cardiology Office Note:    Date:  01/26/2022   ID:  Gina Bauer, DOB Oct 05, 1970, MRN [id number]  PCP:  Robyne Peers, MD   [place] [facility] Cardiologist:  Skeet Latch, MD     Referring MD: Robyne Peers, MD   No chief complaint on file.   History of Present Illness:    Gina Bauer is a 51 y.o. female with no significant past medical history who is being seen today for the evaluation of elevated CRP at the request of Robyne Peers, MD.  She saw her PCP 11/2021 and was feeling well other than fatigue. She had labs done at an integrative medicine doctor and her CRP (26.7) was elevated. Lipids are not elevated and her ASCVD ten year risk was 1.5%. She was referred to cardiology for further management.   Today, she is accompanied by her husband. Since September and becoming more physically active, she has been feeling better and less fatigued. She states that part of her right elbow has been bothering her lately. It especially bothers her when she attempts to lift weights. For exercise, she performs yoga daily and goes for walks and runs frequently. She feels well with these activities. She has been feeling stronger since starting more exercise after September 2023. She believes her diet is good. They try to avoid fast food. She also intermittently fasts. She has never smoked. She drinks alcohol infrequently, around once a month. She denies any palpitations, chest pain, shortness of breath, or peripheral edema. No lightheadedness, headaches, syncope, orthopnea, or PND.  Her mother had a stroke at 60. She also had a leaky valve and had to have 2 heart surgeries. She had rheumatic fever when she was a child. Her mother passed away from heart failure. Her father had a heart attack around 88 years old. Her father also had alzheimer's disease. Her maternal grandmother had rheumatoid arthritis and possibly breast cancer. Her maternal grandfather had leukemia.    Past  Medical History:  Diagnosis Date   Anemia    Anxiety    Bladder disorder    CRP elevated 01/26/2022   Hypothyroid     Past Surgical History:  Procedure Laterality Date   DENTAL SURGERY     MOUTH SURGERY     WISDOM TOOTH EXTRACTION      Current Medications: Current Meds  Medication Sig   NONFORMULARY OR COMPOUNDED ITEM Testosterone 4% cream   norgestimate-ethinyl estradiol (ESTARYLLA) 0.25-35 MG-MCG tablet TAKE 1 ACTIVE TABLET DAILY SKIPPING PLACEBO TABLET   omeprazole (PRILOSEC) 20 MG capsule Take by mouth.   UNABLE TO FIND Med Name: lowest dose thyroid medication     Allergies:   Patient has no known allergies.   Social History   Socioeconomic History   Marital status: Married    Spouse name: Not on file   Number of children: Not on file   Years of education: Not on file   Highest education level: Not on file  Occupational History   Not on file  Tobacco Use   Smoking status: Never   Smokeless tobacco: Never  Vaping Use   Vaping Use: Never used  Substance and Sexual Activity   Alcohol use: Yes    Comment: occ   Drug use: No   Sexual activity: Yes    Partners: Male    Birth control/protection: Pill    Comment: 1st intercourse- 43, partner-3  Other Topics Concern   Not on file  Social History Narrative  Not on file   Social Determinants of Health   Financial Resource Strain: Not on file  Food Insecurity: Not on file  Transportation Needs: Not on file  Physical Activity: Not on file  Stress: Not on file  Social Connections: Not on file     Family History: The patient's family history includes Alzheimer's disease in her father; Breast cancer in her maternal grandmother and mother; Colon polyps in her father; Diabetes in her mother; Heart attack in her father; Heart disease in her mother; Heart failure in her mother; Leukemia in her maternal grandfather; Prostate cancer in her father; Rheum arthritis in her maternal grandmother; Valvular heart disease in  her mother. There is no history of Stomach cancer, Esophageal cancer, or Pancreatic cancer.  ROS:   Please see the history of present illness.    (+) Occasional right elbow pain  All other systems reviewed and are negative.  EKGs/Labs/Other Studies Reviewed:    The following studies were reviewed today: No prior cardiovascular studies.   EKG: EKG is personally reviewed. 01/26/22: Sinus rhythm. Rate 78 bpm.   Recent Labs: No results found for requested labs within last 365 days.  Recent Lipid Panel No results found for: "CHOL", "TRIG", "HDL", "CHOLHDL", "VLDL", "LDLCALC", "LDLDIRECT"   Risk Assessment/Calculations:                Physical Exam:    VS:  BP 120/84   Pulse 78   Ht '5\' 2"'$  (1.575 m)   Wt 140 lb (63.5 kg)   BMI 25.61 kg/m  , BMI Body mass index is 25.61 kg/m. GENERAL:  Well appearing HEENT: Pupils equal round and reactive, fundi not visualized, oral mucosa unremarkable NECK:  No jugular venous distention, waveform within normal limits, carotid upstroke brisk and symmetric, no bruits, no thyromegaly LUNGS:  Clear to auscultation bilaterally HEART:  RRR.  PMI not displaced or sustained,S1 and S2 within normal limits, no S3, no S4, no clicks, no rubs, no murmurs ABD:  Flat, positive bowel sounds normal in frequency in pitch, no bruits, no rebound, no guarding, no midline pulsatile mass, no hepatomegaly, no splenomegaly EXT:  2 plus pulses throughout, no edema, no cyanosis no clubbing SKIN:  No rashes no nodules NEURO:  Cranial nerves II through XII grossly intact, motor grossly intact throughout PSYCH:  Cognitively intact, oriented to person place and time   ASSESSMENT:    1. CRP elevated     PLAN:    In order of problems listed above:  CRP elevated CRP is elevated.  She has no cardiovascular symptoms.  She has no significant family history of cardiovascular disease.  She has excellent exercise regimen and a very good diet.  We will check LP(a) today.   ASCVD 10-year risk is 1.5%.  We will also get a coronary calcium score.  For now, continue working on diet and exercise.  We will reevaluate once we have her calcium score back.  She also has no evidence of any rheumatologic disorders or infection.        Disposition: FU with Stepheny Canal C. Oval Linsey, MD, [facility] in 6 months.  Medication Adjustments/Labs and Tests Ordered: Current medicines are reviewed at length with the patient today.  Concerns regarding medicines are outlined above.  Orders Placed This Encounter  Procedures   CT CARDIAC SCORING (SELF PAY ONLY)   Lipoprotein A (LPA)   EKG 12-Lead   No orders of the defined types were placed in this encounter.   Patient Instructions  Medication  Instructions:  Your Physician recommend you continue on your current medication as directed.    *If you need a refill on your cardiac medications before your next appointment, please call your pharmacy*   Lab Work: Your physician recommends that you return for lab work today- Lpa  If you have labs (blood work) drawn today and your tests are completely normal, you will receive your results only by: MyChart Message (if you have MyChart) OR A paper copy in the mail If you have any lab test that is abnormal or we need to change your treatment, we will call you to review the results.   Testing/Procedures: Your physician has recommend you to have a coronary calcium score. This is a self pay test that will cost $99  Follow-Up: At [facility], you and your health needs are our priority.  As part of our continuing mission to provide you with exceptional heart care, we have created designated Provider Care Teams.  These Care Teams include your primary Cardiologist (physician) and Advanced Practice Providers (APPs -  Physician Assistants and Nurse Practitioners) who all work together to provide you with the care you need, when you need it.  We recommend signing up for the patient portal called  "MyChart".  Sign up information is provided on this After Visit Summary.  MyChart is used to connect with patients for Virtual Visits (Telemedicine).  Patients are able to view lab/test results, encounter notes, upcoming appointments, etc.  Non-urgent messages can be sent to your provider as well.   To learn more about what you can do with MyChart, go to [email or web address].    Your next appointment:   6 month(s)  The format for your next appointment:   In Person  Provider:   Skeet Latch, MD        I,Breanna Adamick,acting as a scribe for Skeet Latch, MD.,have documented all relevant documentation on the behalf of Skeet Latch, MD,as directed by  Skeet Latch, MD while in the presence of Skeet Latch, MD.  I, [place] Oval Linsey, MD have reviewed all documentation for this visit.  The documentation of the exam, diagnosis, procedures, and orders on 01/26/2022 are all accurate and complete.   Signed, Skeet Latch, MD  01/26/2022 1:05 PM    [place]

## 2022-01-26 NOTE — Assessment & Plan Note (Addendum)
CRP is elevated.  She has no cardiovascular symptoms.  She has no significant family history of cardiovascular disease.  She has excellent exercise regimen and a very good diet.  We will check LP(a) today.  ASCVD 10-year risk is 1.5%.  We will also get a coronary calcium score.  For now, continue working on diet and exercise.  We will reevaluate once we have her calcium score back.  She also has no evidence of any rheumatologic disorders or infection.

## 2022-01-27 LAB — LIPOPROTEIN A (LPA): Lipoprotein (a): 32.4 nmol/L (ref <75.0)

## 2022-02-14 IMAGING — US US ABDOMEN LIMITED
1 series · 14 of 25 positions shown · non-contrast
Comparison: None.

CLINICAL DATA: Right upper quadrant abdominal pain

EXAM:
ULTRASOUND ABDOMEN LIMITED RIGHT UPPER QUADRANT

[Series 1: us abdomen limited · 0.09mm/px · 14 of 67 slices shown]
[im 1/67]
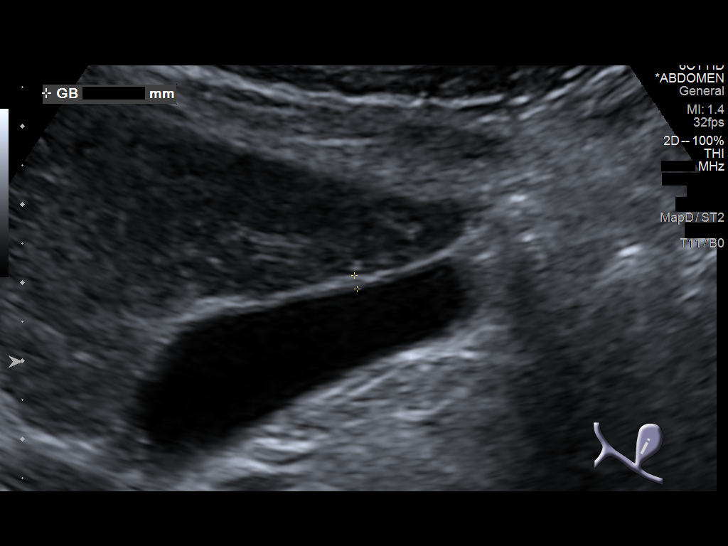
[im 6/67]
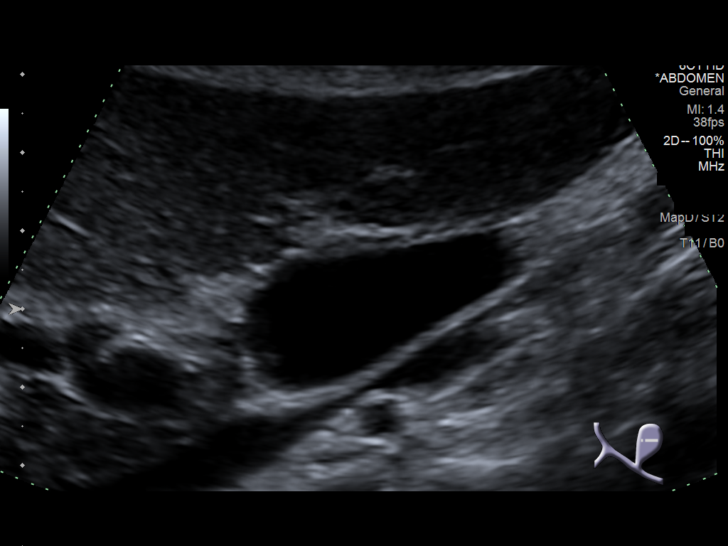
[im 12/67]
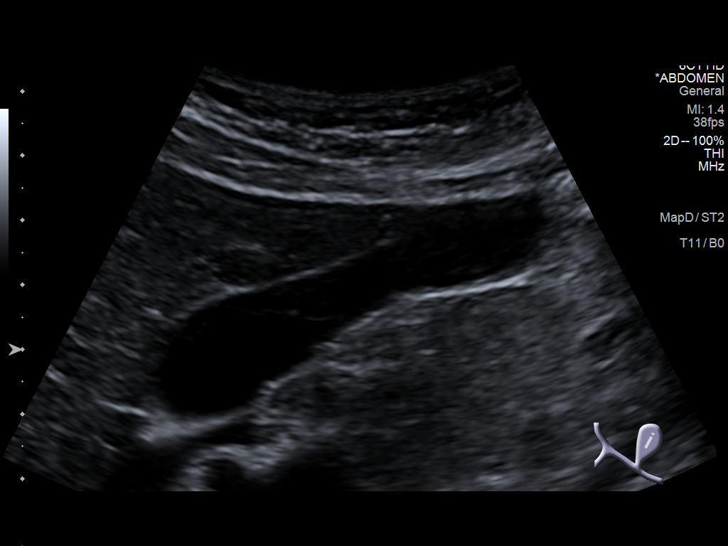
[im 17/67]
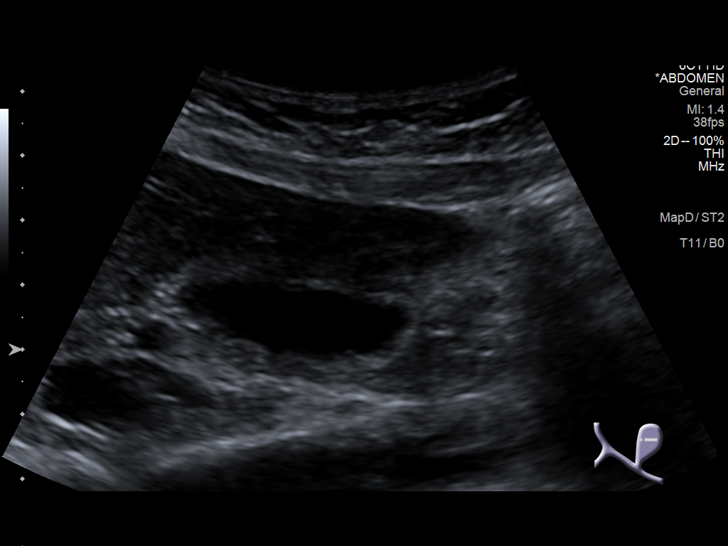
[im 23/67]
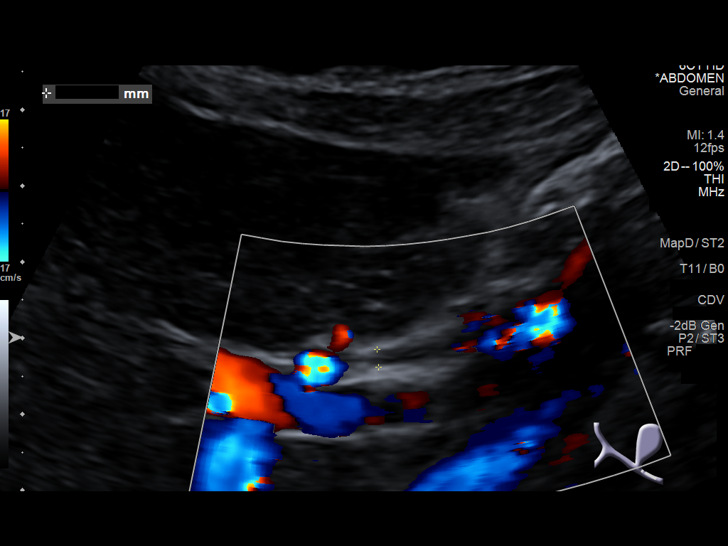
[im 25/67]
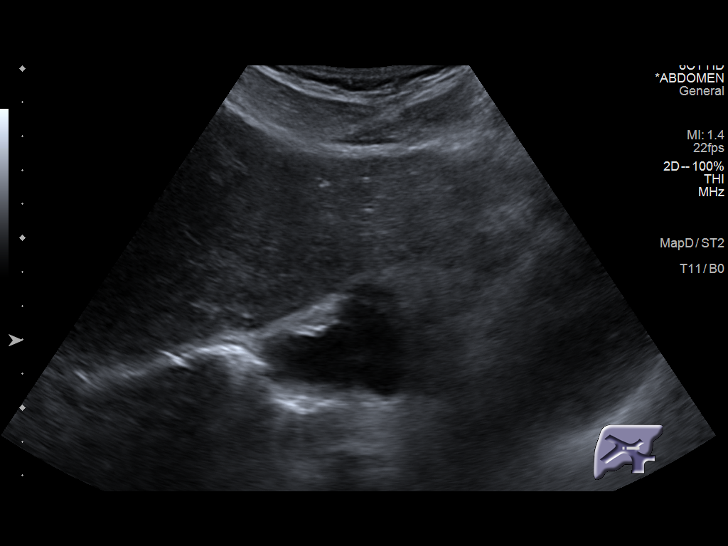
[im 31/67]
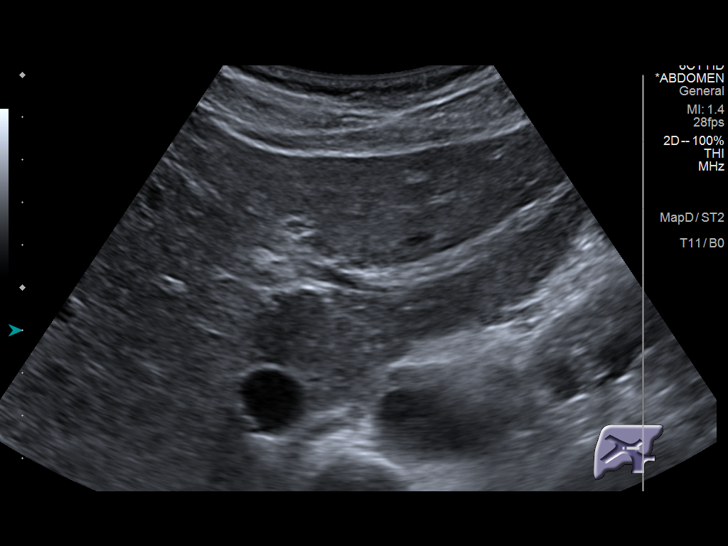
[im 36/67]
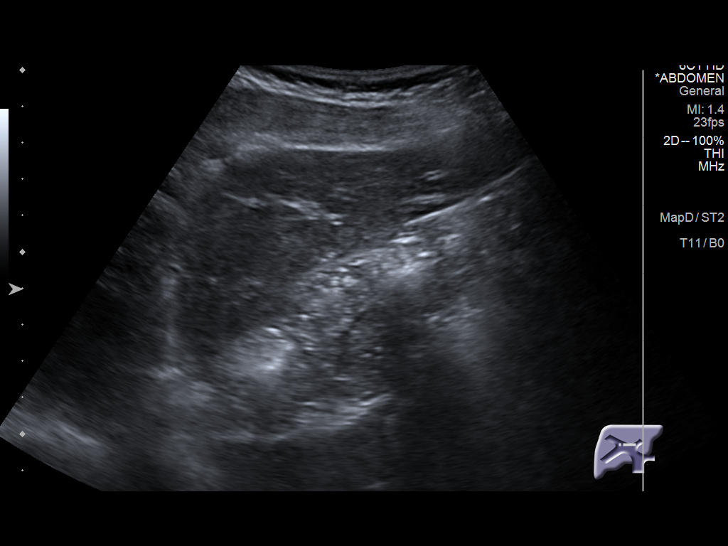
[im 42/67]
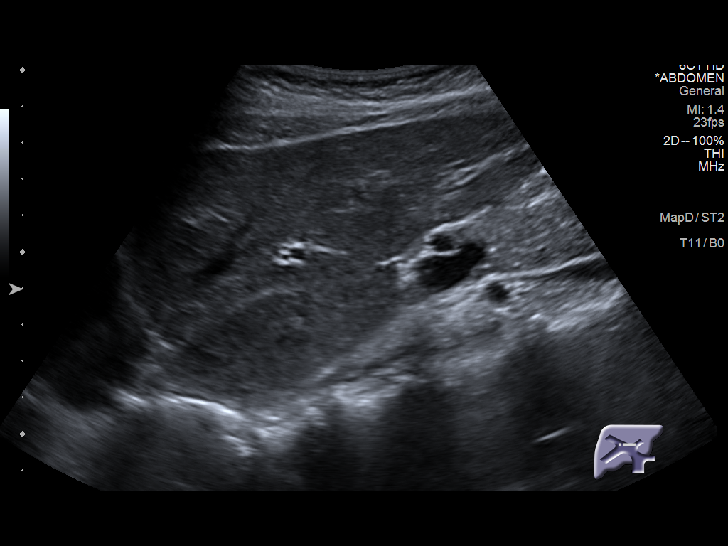
[im 45/67]
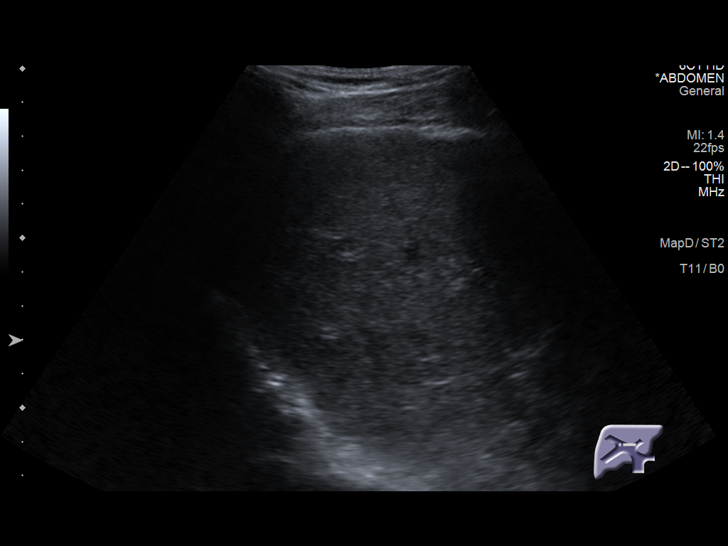
[im 50/67]
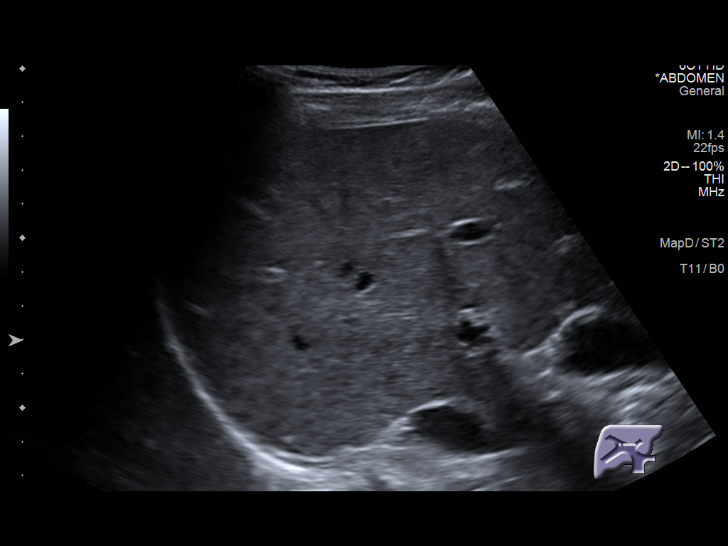
[im 56/67]
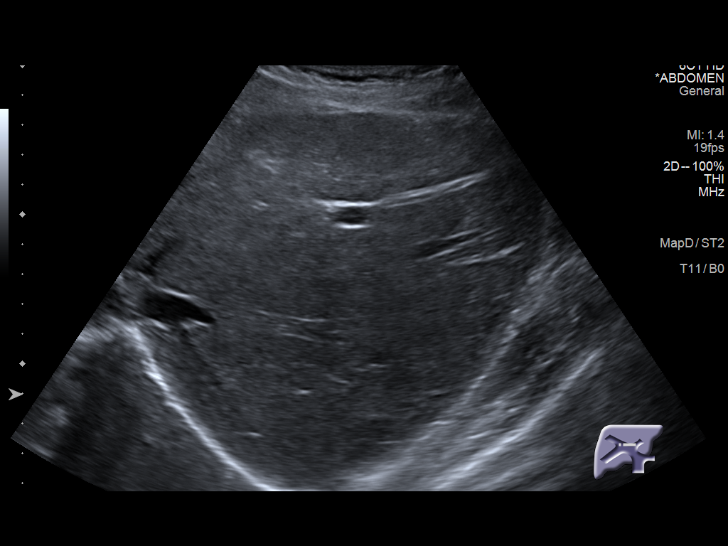
[im 61/67]
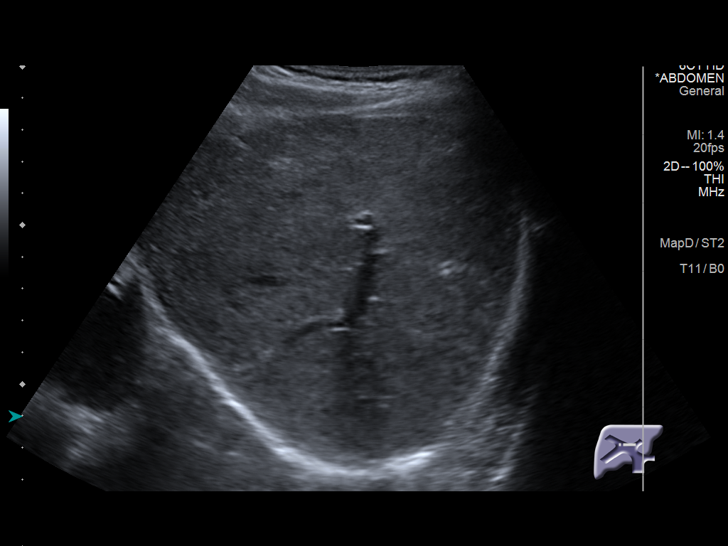
[im 67/67]
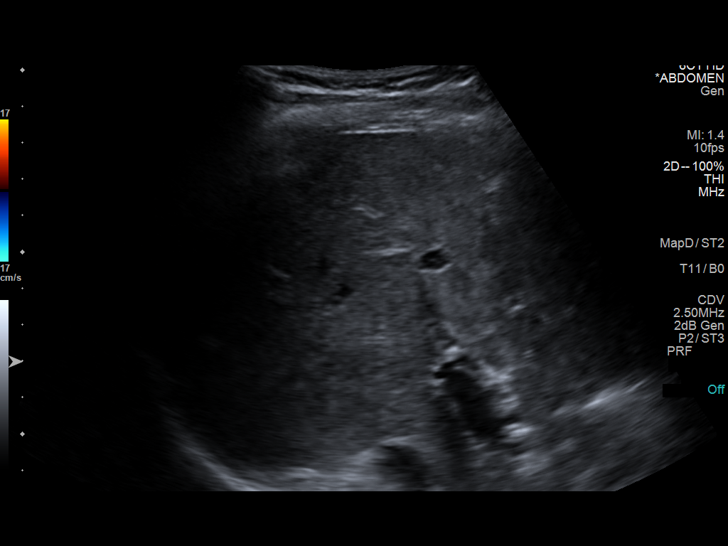

[14 of 25 positions shown; findings below may reference images not displayed]

FINDINGS: Gallbladder:

No gallstones or wall thickening visualized. No sonographic Murphy
sign noted by sonographer.

Common bile duct:

Diameter: Normal caliber, 3 mm

Liver:

No focal lesion identified. Within normal limits in parenchymal
echogenicity. Portal vein is patent on color Doppler imaging with
normal direction of blood flow towards the liver.

Other: None.
IMPRESSION: Unremarkable study.

## 2022-03-04 IMAGING — CT CT ABD-PELV W/ CM
2 of 5 series · 14 of 46 positions shown, 16 images · IV contrast (omnipaque)
Comparison: None.
COMPARISON: None.

Addendum:
CLINICAL DATA: Right-sided flank pain

EXAM:
CT ABDOMEN AND PELVIS WITH CONTRAST
TECHNIQUE: Multidetector CT imaging of the abdomen and pelvis was performed
using the standard protocol following bolus administration of
intravenous contrast.
CONTRAST:  100mL OMNIPAQUE IOHEXOL 300 MG/ML  SOLN

[Series 2: axial st · axial · 0.64mm/px · z∈[-539,-184]mm · 11 of 83 slices shown, 13 images]
[im 6/83  soft-tissue]
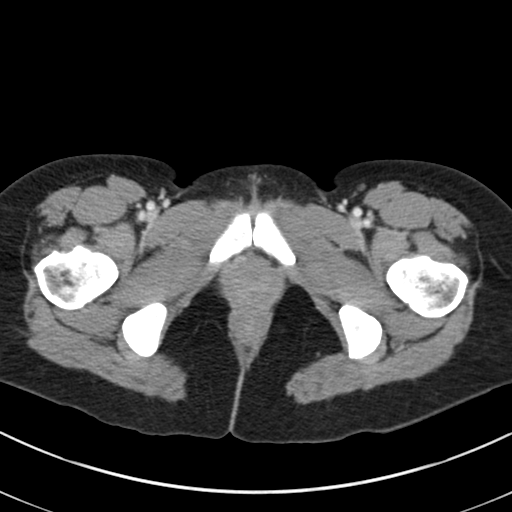
[im 6/83  bone]
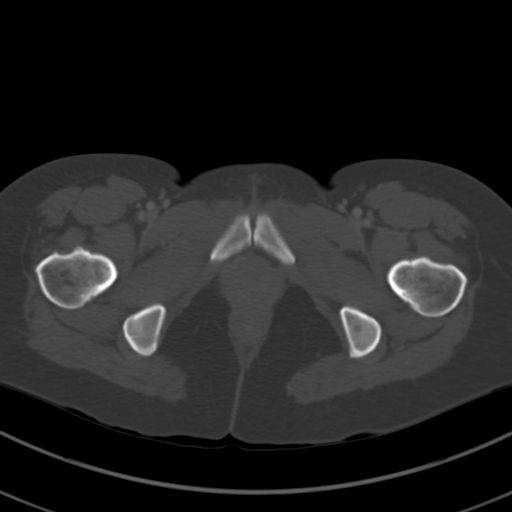
[im 11/83  soft-tissue]
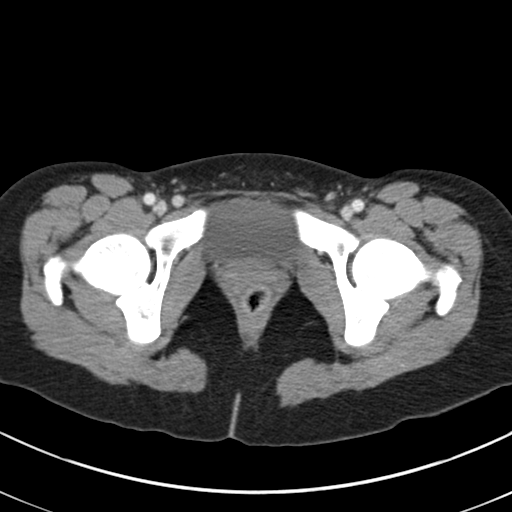
[im 22/83  soft-tissue]
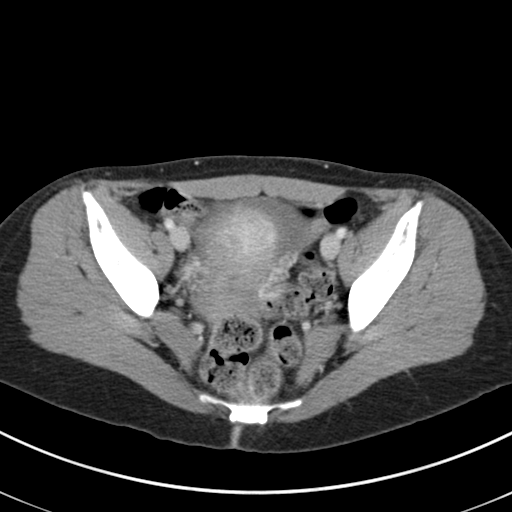
[im 28/83  soft-tissue]
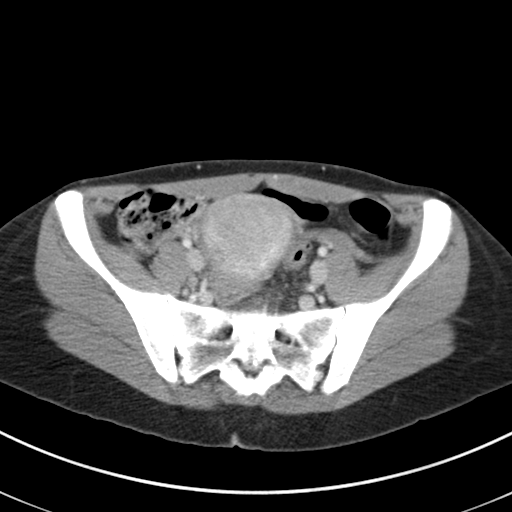
[im 33/83  soft-tissue]
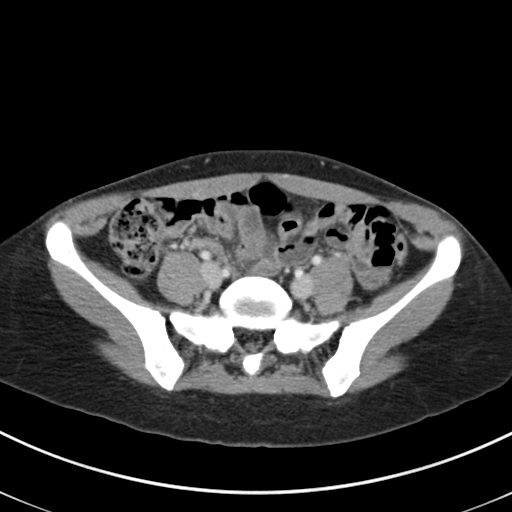
[im 44/83  soft-tissue]
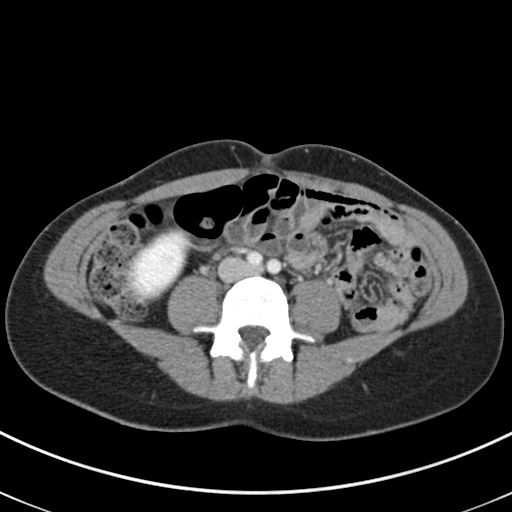
[im 50/83  soft-tissue]
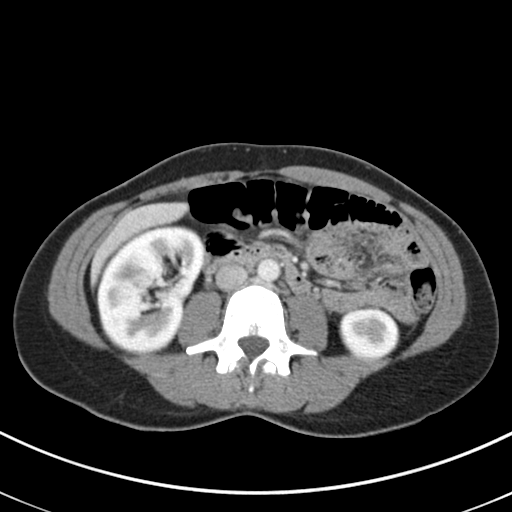
[im 55/83  soft-tissue]
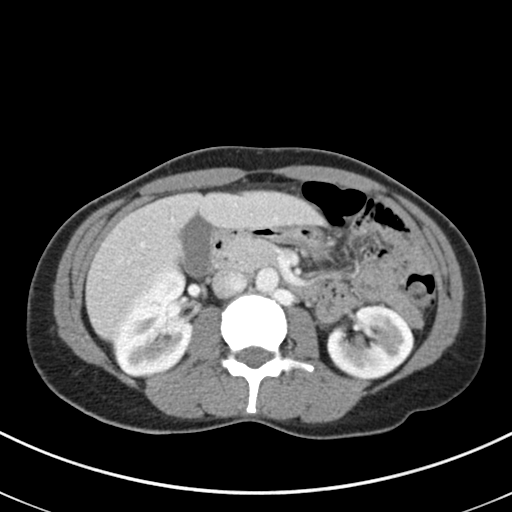
[im 61/83  soft-tissue]
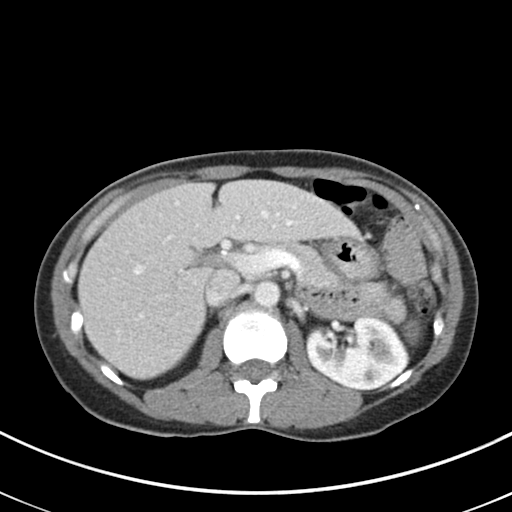
[im 61/83  bone]
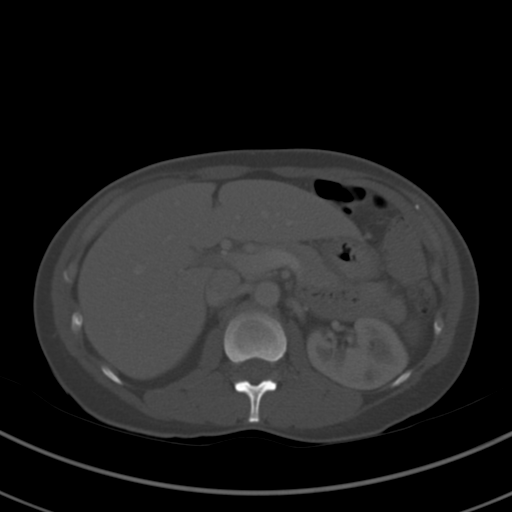
[im 72/83  soft-tissue]
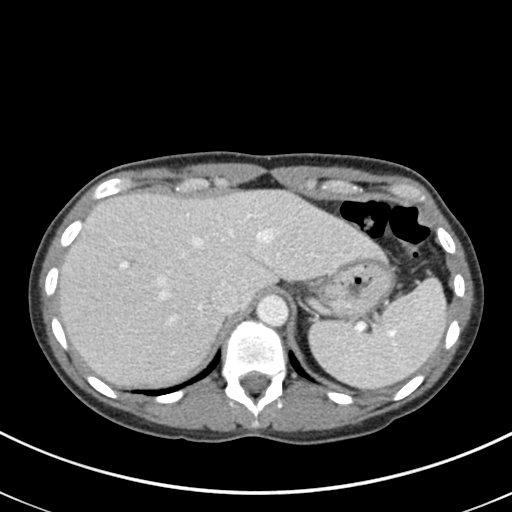
[im 77/83  soft-tissue]
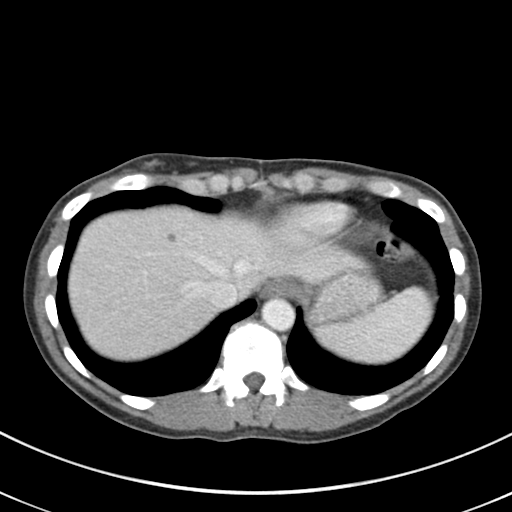

[Series 5: coronal st · coronal · 0.64mm/px · 3 of 101 slices shown]
[im 34/101  soft-tissue]
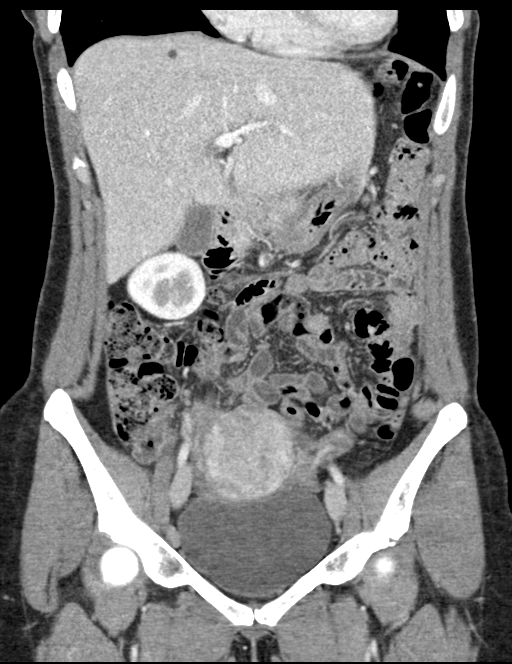
[im 45/101  soft-tissue]
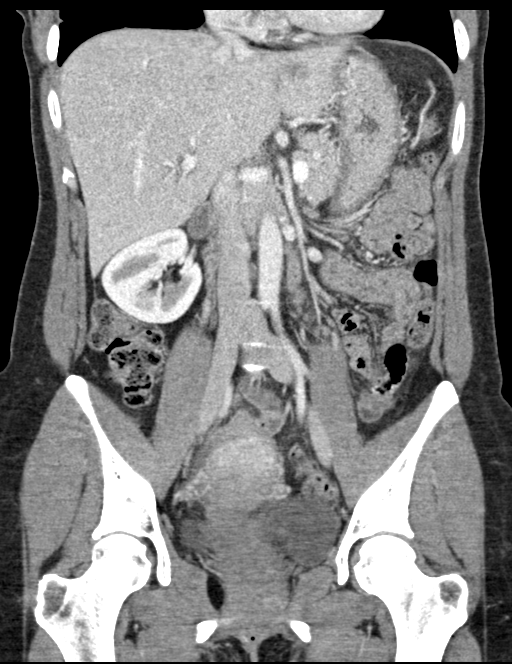
[im 56/101  soft-tissue]
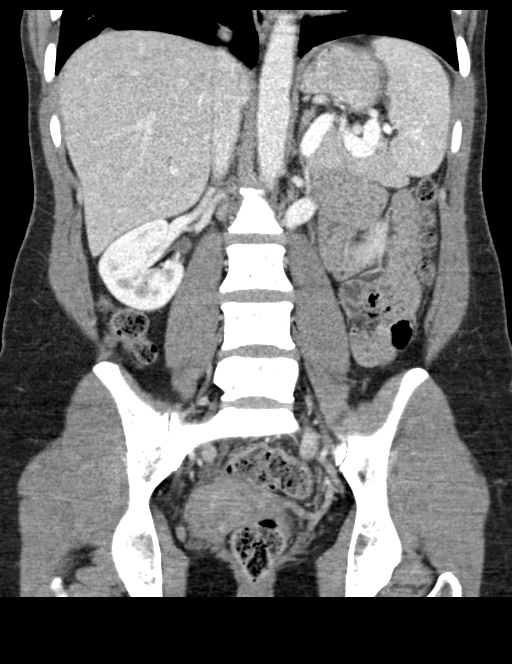

[14 of 46 positions shown; findings below may reference images not displayed]

FINDINGS: Lower chest: The visualized heart size within normal limits. No
pericardial fluid/thickening.

No hiatal hernia.

The visualized portions of the lungs are clear.

Hepatobiliary: There is a 1.5 cm low-density lesion seen at the
posterior aspect of the right liver lobe. For the main portal vein
is patent. No evidence of calcified gallstones, gallbladder wall
thickening or biliary dilatation.

Pancreas: Unremarkable. No pancreatic ductal dilatation or
surrounding inflammatory changes.

Spleen: Normal in size without focal abnormality.

Adrenals/Urinary Tract: Both adrenal glands appear normal. The
kidneys and collecting system appear normal without evidence of
urinary tract calculus or hydronephrosis. Small parapelvic cysts
seen within the lower pole the right kidney. Bladder is
unremarkable.

Stomach/Bowel: The stomach, small bowel, and colon are normal in
appearance. No inflammatory changes, wall thickening, or obstructive
findings.The appendix is not well seen, however there is no
inflammatory changes seen in the right lower quadrant.

Vascular/Lymphatic: There are no enlarged mesenteric,
retroperitoneal, or pelvic lymph nodes. No significant vascular
findings are present.

Reproductive: A large heterogeneously enhancing uterine fibroid seen
at the superior aspect. A small amount of physiologic free fluid
seen within the deep pelvis.

Other: No evidence of abdominal wall mass or hernia.

Musculoskeletal: No acute or significant osseous findings.
IMPRESSION: No renal or collecting system calculi.

Large heterogeneously enhancing uterine fibroid.

Moderate amount of colonic stool.

ADDENDUM:
Heterogeneously enhancing uterine fundus which appears slightly
globular as on the ultrasound performed same day. This could could
be due to adenomyosis or normal hyperenhancement. There is also
small a hyperenhancing lesion seen within the left uterine fundus,
series 5, image 32 measuring 1.1 cm, likely small intrauterine
fibroid.

*** End of Addendum ***
FINDINGS: Lower chest: The visualized heart size within normal limits. No
pericardial fluid/thickening.

No hiatal hernia.

The visualized portions of the lungs are clear.

Hepatobiliary: There is a 1.5 cm low-density lesion seen at the
posterior aspect of the right liver lobe. For the main portal vein
is patent. No evidence of calcified gallstones, gallbladder wall
thickening or biliary dilatation.

Pancreas: Unremarkable. No pancreatic ductal dilatation or
surrounding inflammatory changes.

Spleen: Normal in size without focal abnormality.

Adrenals/Urinary Tract: Both adrenal glands appear normal. The
kidneys and collecting system appear normal without evidence of
urinary tract calculus or hydronephrosis. Small parapelvic cysts
seen within the lower pole the right kidney. Bladder is
unremarkable.

Stomach/Bowel: The stomach, small bowel, and colon are normal in
appearance. No inflammatory changes, wall thickening, or obstructive
findings.The appendix is not well seen, however there is no
inflammatory changes seen in the right lower quadrant.

Vascular/Lymphatic: There are no enlarged mesenteric,
retroperitoneal, or pelvic lymph nodes. No significant vascular
findings are present.

Reproductive: A large heterogeneously enhancing uterine fibroid seen
at the superior aspect. A small amount of physiologic free fluid
seen within the deep pelvis.

Other: No evidence of abdominal wall mass or hernia.

Musculoskeletal: No acute or significant osseous findings.
IMPRESSION: No renal or collecting system calculi.

Large heterogeneously enhancing uterine fibroid.

Moderate amount of colonic stool.

## 2022-03-12 ENCOUNTER — Ambulatory Visit (HOSPITAL_BASED_OUTPATIENT_CLINIC_OR_DEPARTMENT_OTHER)
Admission: RE | Admit: 2022-03-12 | Discharge: 2022-03-12 | Disposition: A | Discharge status: Home / Self Care | Payer: BC Managed Care – PPO | Source: Ambulatory Visit | Attending: Cardiovascular Disease | Admitting: Cardiovascular Disease

## 2022-03-12 DIAGNOSIS — R7982 Elevated C-reactive protein (CRP): Secondary | ICD-10-CM

## 2022-04-02 DIAGNOSIS — Z1231 Encounter for screening mammogram for malignant neoplasm of breast: Secondary | ICD-10-CM | POA: Diagnosis not present

## 2022-04-03 DIAGNOSIS — E538 Deficiency of other specified B group vitamins: Secondary | ICD-10-CM | POA: Diagnosis not present

## 2022-04-03 DIAGNOSIS — B2790 Infectious mononucleosis, unspecified without complication: Secondary | ICD-10-CM | POA: Diagnosis not present

## 2022-04-03 DIAGNOSIS — E559 Vitamin D deficiency, unspecified: Secondary | ICD-10-CM | POA: Diagnosis not present

## 2022-04-03 DIAGNOSIS — E039 Hypothyroidism, unspecified: Secondary | ICD-10-CM | POA: Diagnosis not present

## 2022-04-08 DIAGNOSIS — D509 Iron deficiency anemia, unspecified: Secondary | ICD-10-CM | POA: Insufficient documentation

## 2022-04-08 DIAGNOSIS — J0141 Acute recurrent pansinusitis: Secondary | ICD-10-CM | POA: Diagnosis not present

## 2022-04-08 DIAGNOSIS — J060 Acute laryngopharyngitis: Secondary | ICD-10-CM | POA: Diagnosis not present

## 2022-04-08 DIAGNOSIS — F4322 Adjustment disorder with anxiety: Secondary | ICD-10-CM | POA: Insufficient documentation

## 2022-04-08 DIAGNOSIS — J069 Acute upper respiratory infection, unspecified: Secondary | ICD-10-CM | POA: Diagnosis not present

## 2022-04-08 DIAGNOSIS — K219 Gastro-esophageal reflux disease without esophagitis: Secondary | ICD-10-CM | POA: Insufficient documentation

## 2022-04-08 DIAGNOSIS — N6002 Solitary cyst of left breast: Secondary | ICD-10-CM | POA: Diagnosis not present

## 2022-04-08 DIAGNOSIS — G9332 Myalgic encephalomyelitis/chronic fatigue syndrome: Secondary | ICD-10-CM | POA: Insufficient documentation

## 2022-04-10 DIAGNOSIS — R6882 Decreased libido: Secondary | ICD-10-CM | POA: Diagnosis not present

## 2022-04-10 DIAGNOSIS — R5383 Other fatigue: Secondary | ICD-10-CM | POA: Diagnosis not present

## 2022-04-10 DIAGNOSIS — N926 Irregular menstruation, unspecified: Secondary | ICD-10-CM | POA: Diagnosis not present

## 2022-04-10 DIAGNOSIS — E039 Hypothyroidism, unspecified: Secondary | ICD-10-CM | POA: Diagnosis not present

## 2022-04-13 ENCOUNTER — Encounter: Payer: Self-pay | Admitting: Obstetrics & Gynecology

## 2022-04-27 ENCOUNTER — Telehealth: Payer: Self-pay

## 2022-04-27 NOTE — Telephone Encounter (Signed)
Sounds like she may be having some perimenopausal symptoms. During this time she may experience worsening migraines and breast tenderness due to fluctuations in hormones, as well as some changes in bleeding. Her breast imaging showed a cyst in her left breast, otherwise unremarkable. If breast discomfort does not improve after 1 week she should be seen for breast exam.

## 2022-04-27 NOTE — Telephone Encounter (Signed)
ML pt calling to report achiness/discomfort in b/l breasts--more on the right side. However, has had breast imaging about 2 weeks ago w/ needing diagnostic done (results in chart). Pt also reports hx of migraines and feels as of they have been more frequent in the last couple weeks also. Pt reports a cycle about 3-3.5 weeks ago for about 1-1.5 days of light bleeding and also noticed some spotting today. Pt states her last cycle before that was about 55 days prior. Takings COCs for mgmt of migraines, denies missing any doses.  Pt denies any changes with breast in doing SBEs. Denies recent fevers and/or vaccines.  Please advise.

## 2022-04-27 NOTE — Telephone Encounter (Signed)
Left detailed VM per DPR

## 2022-06-11 DIAGNOSIS — N959 Unspecified menopausal and perimenopausal disorder: Secondary | ICD-10-CM | POA: Diagnosis not present

## 2022-07-21 DIAGNOSIS — F4322 Adjustment disorder with anxiety: Secondary | ICD-10-CM | POA: Diagnosis not present

## 2022-07-31 DIAGNOSIS — F4322 Adjustment disorder with anxiety: Secondary | ICD-10-CM | POA: Diagnosis not present

## 2022-08-06 DIAGNOSIS — E039 Hypothyroidism, unspecified: Secondary | ICD-10-CM | POA: Diagnosis not present

## 2022-08-06 DIAGNOSIS — Z713 Dietary counseling and surveillance: Secondary | ICD-10-CM | POA: Diagnosis not present

## 2022-08-06 DIAGNOSIS — Z6825 Body mass index (BMI) 25.0-25.9, adult: Secondary | ICD-10-CM | POA: Diagnosis not present

## 2022-08-07 DIAGNOSIS — F4322 Adjustment disorder with anxiety: Secondary | ICD-10-CM | POA: Diagnosis not present

## 2022-08-13 ENCOUNTER — Ambulatory Visit (HOSPITAL_BASED_OUTPATIENT_CLINIC_OR_DEPARTMENT_OTHER): Payer: BC Managed Care – PPO | Admitting: Cardiovascular Disease

## 2022-08-13 ENCOUNTER — Encounter (HOSPITAL_BASED_OUTPATIENT_CLINIC_OR_DEPARTMENT_OTHER): Payer: Self-pay | Admitting: Cardiovascular Disease

## 2022-08-13 VITALS — BP 106/71 | HR 75 | Ht 62.0 in | Wt 139.4 lb

## 2022-08-13 DIAGNOSIS — R7982 Elevated C-reactive protein (CRP): Secondary | ICD-10-CM | POA: Diagnosis not present

## 2022-08-13 NOTE — Progress Notes (Signed)
Cardiology Office Note:    Date:  08/13/2022   ID:  Gina Bauer, DOB 1970/06/13, MRN [id number]  PCP:  Angelica Chessman, MD   [facility] Providers Cardiologist:  Chilton Si, MD     Referring MD: Angelica Chessman, MD   No chief complaint on file.   History of Present Illness:    Gina Bauer is a 52 y.o. female with hypothyroidism here for follow-up.  She was first seen 01/2022 for the evaluation of elevated CRP.  She saw her PCP 11/2021 and was feeling well other than fatigue. She had labs done at an integrative medicine doctor and her cardiac CRP (26.7) was elevated. Lipids are not elevated and her ASCVD ten year risk was 1.5%. She was referred to cardiology for further management.  At her visit she had no cardiac complaints.  She was exercising regularly without symptoms.  She tried to have a healthy diet.  She did note that her mother had a stroke at age 55 but she had rheumatic fever as a child.  She was referred for coronary calcium score 02/2022 that revealed calcium score of 0.  LP(a) was 32.4.  [name] reports that she has been advised by her gynecologist and nutritionist to walk daily for 30 minutes and continue her yoga sessions three times a week, aiming for a total of 150 minutes of moderate to vigorous activity weekly. She has been following this regimen for almost a week. She feels okay during these exercises and has not experienced any problems.  Regarding her diet, [name] mentions efforts to manage her eating habits, particularly avoiding eating after 8 PM and managing her fasting window. She uses a food logging app to monitor her intake and considers increasing her food consumption during the day to reduce nighttime snacking. She feels that she runs out of energy by the end of the day.  She notes that she started taking estrogen in an effort to lower her cardiovascular risk.  She wasn't experiencing menopausal symptoms.    Past Medical History:   Diagnosis Date   Anemia    Anxiety    Bladder disorder    CRP elevated 01/26/2022   Hypothyroid     Past Surgical History:  Procedure Laterality Date   DENTAL SURGERY     MOUTH SURGERY     WISDOM TOOTH EXTRACTION      Current Medications: Current Meds  Medication Sig   Cholecalciferol (VITAMIN D-3 PO) Take 1 tablet by mouth daily.   cyanocobalamin 100 MCG tablet Take 100 mcg by mouth daily.   estradiol (ESTRACE) 0.1 MG/GM vaginal cream Place 1 Applicatorful vaginally 3 (three) times a week.   estradiol (VIVELLE-DOT) 0.0375 MG/24HR Place 1 patch onto the skin 2 (two) times a week.   lansoprazole (PREVACID) 30 MG capsule Take 30 mg by mouth every morning.   NONFORMULARY OR COMPOUNDED ITEM Testosterone 4% cream   norgestimate-ethinyl estradiol (ESTARYLLA) 0.25-35 MG-MCG tablet TAKE 1 ACTIVE TABLET DAILY SKIPPING PLACEBO TABLET   omeprazole (PRILOSEC) 20 MG capsule Take by mouth.   progesterone (PROMETRIUM) 100 MG capsule Take 100 mg by mouth at bedtime.   thyroid (ARMOUR) 15 MG tablet Take 15 mg by mouth daily.   UNABLE TO FIND Med Name: lowest dose thyroid medication     Allergies:   Patient has no known allergies.   Social History   Socioeconomic History   Marital status: Married    Spouse name: Not on file  Number of children: Not on file   Years of education: Not on file   Highest education level: Not on file  Occupational History   Not on file  Tobacco Use   Smoking status: Never   Smokeless tobacco: Never  Vaping Use   Vaping Use: Never used  Substance and Sexual Activity   Alcohol use: Yes    Comment: occ   Drug use: No   Sexual activity: Yes    Partners: Male    Birth control/protection: Pill    Comment: 1st intercourse- 19, partner-3  Other Topics Concern   Not on file  Social History Narrative   Not on file   Social Determinants of Health   Financial Resource Strain: Not on file  Food Insecurity: Not on file  Transportation Needs: Not on file   Physical Activity: Not on file  Stress: Not on file  Social Connections: Not on file     Family History: The patient's family history includes Alzheimer's disease in her father; Breast cancer in her maternal grandmother and mother; Colon polyps in her father; Diabetes in her mother; Heart attack in her father; Heart disease in her mother; Heart failure in her mother; Leukemia in her maternal grandfather; Prostate cancer in her father; Rheum arthritis in her maternal grandmother; Valvular heart disease in her mother. There is no history of Stomach cancer, Esophageal cancer, or Pancreatic cancer.  ROS:   Please see the history of present illness.    (+) Occasional right elbow pain  All other systems reviewed and are negative.  EKGs/Labs/Other Studies Reviewed:    The following studies were reviewed today: No prior cardiovascular studies.   EKG: EKG is personally reviewed. 01/26/22: Sinus rhythm. Rate 78 bpm.   Recent Labs: No results found for requested labs within last 365 days.  Recent Lipid Panel No results found for: "CHOL", "TRIG", "HDL", "CHOLHDL", "VLDL", "LDLCALC", "LDLDIRECT"   Risk Assessment/Calculations:                Physical Exam:    VS:  BP 106/71 (BP Location: Right Arm, Patient Position: Sitting, Cuff Size: Normal)   Pulse 75   Ht 5\' 2"  (1.575 m)   Wt 139 lb 6.4 oz (63.2 kg)   SpO2 99%   BMI 25.50 kg/m  , BMI Body mass index is 25.5 kg/m. GENERAL:  Well appearing HEENT: Pupils equal round and reactive, fundi not visualized, oral mucosa unremarkable NECK:  No jugular venous distention, waveform within normal limits, carotid upstroke brisk and symmetric, no bruits, no thyromegaly LUNGS:  Clear to auscultation bilaterally HEART:  RRR.  PMI not displaced or sustained,S1 and S2 within normal limits, no S3, no S4, no clicks, no rubs, no murmurs ABD:  Flat, positive bowel sounds normal in frequency in pitch, no bruits, no rebound, no guarding, no midline  pulsatile mass, no hepatomegaly, no splenomegaly EXT:  2 plus pulses throughout, no edema, no cyanosis no clubbing SKIN:  No rashes no nodules NEURO:  Cranial nerves II through XII grossly intact, motor grossly intact throughout PSYCH:  Cognitively intact, oriented to person place and time   ASSESSMENT:    1. CRP elevated      PLAN:    In order of problems listed above:  # Cardiovascular Risk Assessment: - Calcium score: 0 (no plaque) - LDL: borderline - LP(a): low - CRP: elevated, but nonspecific - Continue current exercise regimen (150 minutes/week of moderate to vigorous activity) - Maintain a healthy diet and avoid nighttime  snacking - Recheck calcium score in December 2028 - Follow up in 2 years to ensure stability and monitor lipid levels with primary care provider  # Estrogen Supplementation: - Patient started estrogen for menopausal symptoms - Continue estrogen supplementation for symptom management, but not for cardiovascular risk reduction - Use estrogen for the shortest duration necessary to manage symptoms   Disposition: FU with Mason Dibiasio C. Duke Salvia, MD, [facility] in 2 years  Medication Adjustments/Labs and Tests Ordered: Current medicines are reviewed at length with the patient today.  Concerns regarding medicines are outlined above.  No orders of the defined types were placed in this encounter.  No orders of the defined types were placed in this encounter.   Patient Instructions  .Medication Instructions:  Your physician recommends that you continue on your current medications as directed. Please refer to the Current Medication list given to you today.   *If you need a refill on your cardiac medications before your next appointment, please call your pharmacy*  Lab Work: NONE  Testing/Procedures: NONE  Follow-Up: At [facility], you and your health needs are our priority.  As part of our continuing mission to provide you with exceptional heart  care, we have created designated Provider Care Teams.  These Care Teams include your primary Cardiologist (physician) and Advanced Practice Providers (APPs -  Physician Assistants and Nurse Practitioners) who all work together to provide you with the care you need, when you need it.  We recommend signing up for the patient portal called "MyChart".  Sign up information is provided on this After Visit Summary.  MyChart is used to connect with patients for Virtual Visits (Telemedicine).  Patients are able to view lab/test results, encounter notes, upcoming appointments, etc.  Non-urgent messages can be sent to your provider as well.   To learn more about what you can do with MyChart, go to [email or web address].    Your next appointment:   2 year(s)  Provider:   Chilton Si, MD         Signed, Chilton Si, MD  08/13/2022 9:05 AM    [facility]

## 2022-08-13 NOTE — Patient Instructions (Signed)
.  Medication Instructions:  Your physician recommends that you continue on your current medications as directed. Please refer to the Current Medication list given to you today.   *If you need a refill on your cardiac medications before your next appointment, please call your pharmacy*  Lab Work: NONE  Testing/Procedures: NONE  Follow-Up: At [facility], you and your health needs are our priority.  As part of our continuing mission to provide you with exceptional heart care, we have created designated Provider Care Teams.  These Care Teams include your primary Cardiologist (physician) and Advanced Practice Providers (APPs -  Physician Assistants and Nurse Practitioners) who all work together to provide you with the care you need, when you need it.  We recommend signing up for the patient portal called "MyChart".  Sign up information is provided on this After Visit Summary.  MyChart is used to connect with patients for Virtual Visits (Telemedicine).  Patients are able to view lab/test results, encounter notes, upcoming appointments, etc.  Non-urgent messages can be sent to your provider as well.   To learn more about what you can do with MyChart, go to [email or web address].    Your next appointment:   2 year(s)  Provider:   Chilton Si, MD

## 2022-08-21 DIAGNOSIS — F4322 Adjustment disorder with anxiety: Secondary | ICD-10-CM | POA: Diagnosis not present

## 2022-08-27 DIAGNOSIS — Z13820 Encounter for screening for osteoporosis: Secondary | ICD-10-CM | POA: Diagnosis not present

## 2022-09-27 ENCOUNTER — Other Ambulatory Visit: Payer: Self-pay | Admitting: Gastroenterology

## 2022-10-06 DIAGNOSIS — Z6825 Body mass index (BMI) 25.0-25.9, adult: Secondary | ICD-10-CM | POA: Diagnosis not present

## 2022-10-06 DIAGNOSIS — Z713 Dietary counseling and surveillance: Secondary | ICD-10-CM | POA: Diagnosis not present

## 2022-10-07 DIAGNOSIS — R070 Pain in throat: Secondary | ICD-10-CM | POA: Diagnosis not present

## 2022-10-23 ENCOUNTER — Other Ambulatory Visit: Payer: Self-pay | Admitting: Obstetrics & Gynecology

## 2022-10-23 DIAGNOSIS — F4322 Adjustment disorder with anxiety: Secondary | ICD-10-CM | POA: Diagnosis not present

## 2022-10-29 ENCOUNTER — Ambulatory Visit: Payer: BC Managed Care – PPO | Admitting: Obstetrics & Gynecology

## 2022-11-08 DIAGNOSIS — R300 Dysuria: Secondary | ICD-10-CM | POA: Diagnosis not present

## 2022-11-08 DIAGNOSIS — N3001 Acute cystitis with hematuria: Secondary | ICD-10-CM | POA: Diagnosis not present

## 2022-11-08 DIAGNOSIS — R110 Nausea: Secondary | ICD-10-CM | POA: Diagnosis not present

## 2022-11-27 DIAGNOSIS — F4322 Adjustment disorder with anxiety: Secondary | ICD-10-CM | POA: Diagnosis not present

## 2022-12-04 DIAGNOSIS — Z01419 Encounter for gynecological examination (general) (routine) without abnormal findings: Secondary | ICD-10-CM | POA: Diagnosis not present

## 2022-12-04 DIAGNOSIS — Z6825 Body mass index (BMI) 25.0-25.9, adult: Secondary | ICD-10-CM | POA: Diagnosis not present

## 2022-12-10 DIAGNOSIS — R7982 Elevated C-reactive protein (CRP): Secondary | ICD-10-CM | POA: Diagnosis not present

## 2022-12-10 DIAGNOSIS — Z0000 Encounter for general adult medical examination without abnormal findings: Secondary | ICD-10-CM | POA: Diagnosis not present

## 2022-12-10 DIAGNOSIS — Z780 Asymptomatic menopausal state: Secondary | ICD-10-CM | POA: Insufficient documentation

## 2022-12-10 DIAGNOSIS — E039 Hypothyroidism, unspecified: Secondary | ICD-10-CM | POA: Diagnosis not present

## 2022-12-10 DIAGNOSIS — R7989 Other specified abnormal findings of blood chemistry: Secondary | ICD-10-CM | POA: Diagnosis not present

## 2022-12-10 DIAGNOSIS — Z0001 Encounter for general adult medical examination with abnormal findings: Secondary | ICD-10-CM | POA: Diagnosis not present

## 2023-01-01 DIAGNOSIS — F4322 Adjustment disorder with anxiety: Secondary | ICD-10-CM | POA: Diagnosis not present

## 2023-01-19 DIAGNOSIS — L814 Other melanin hyperpigmentation: Secondary | ICD-10-CM | POA: Diagnosis not present

## 2023-01-19 DIAGNOSIS — D1801 Hemangioma of skin and subcutaneous tissue: Secondary | ICD-10-CM | POA: Diagnosis not present

## 2023-01-19 DIAGNOSIS — L821 Other seborrheic keratosis: Secondary | ICD-10-CM | POA: Diagnosis not present

## 2023-01-22 DIAGNOSIS — F4322 Adjustment disorder with anxiety: Secondary | ICD-10-CM | POA: Diagnosis not present

## 2023-01-28 DIAGNOSIS — Z6825 Body mass index (BMI) 25.0-25.9, adult: Secondary | ICD-10-CM | POA: Diagnosis not present

## 2023-01-28 DIAGNOSIS — Z713 Dietary counseling and surveillance: Secondary | ICD-10-CM | POA: Diagnosis not present

## 2023-02-04 DIAGNOSIS — R350 Frequency of micturition: Secondary | ICD-10-CM | POA: Diagnosis not present

## 2023-02-04 DIAGNOSIS — N952 Postmenopausal atrophic vaginitis: Secondary | ICD-10-CM | POA: Diagnosis not present

## 2023-02-04 DIAGNOSIS — N3010 Interstitial cystitis (chronic) without hematuria: Secondary | ICD-10-CM | POA: Diagnosis not present

## 2023-04-08 DIAGNOSIS — Z1231 Encounter for screening mammogram for malignant neoplasm of breast: Secondary | ICD-10-CM | POA: Diagnosis not present

## 2023-04-12 ENCOUNTER — Encounter (HOSPITAL_COMMUNITY): Payer: Self-pay

## 2023-04-12 ENCOUNTER — Emergency Department (HOSPITAL_COMMUNITY)
Admission: EM | Admit: 2023-04-12 | Discharge: 2023-04-12 | Disposition: A | Discharge status: Home / Self Care | Payer: BC Managed Care – PPO | Attending: Emergency Medicine | Admitting: Emergency Medicine

## 2023-04-12 ENCOUNTER — Other Ambulatory Visit: Payer: Self-pay

## 2023-04-12 ENCOUNTER — Emergency Department (HOSPITAL_COMMUNITY): Payer: BC Managed Care – PPO

## 2023-04-12 DIAGNOSIS — N281 Cyst of kidney, acquired: Secondary | ICD-10-CM | POA: Diagnosis not present

## 2023-04-12 DIAGNOSIS — K529 Noninfective gastroenteritis and colitis, unspecified: Secondary | ICD-10-CM | POA: Insufficient documentation

## 2023-04-12 DIAGNOSIS — R112 Nausea with vomiting, unspecified: Secondary | ICD-10-CM | POA: Diagnosis not present

## 2023-04-12 DIAGNOSIS — Z20822 Contact with and (suspected) exposure to covid-19: Secondary | ICD-10-CM | POA: Insufficient documentation

## 2023-04-12 DIAGNOSIS — R109 Unspecified abdominal pain: Secondary | ICD-10-CM | POA: Diagnosis not present

## 2023-04-12 DIAGNOSIS — K3580 Unspecified acute appendicitis: Secondary | ICD-10-CM | POA: Diagnosis not present

## 2023-04-12 DIAGNOSIS — R1084 Generalized abdominal pain: Secondary | ICD-10-CM | POA: Diagnosis not present

## 2023-04-12 DIAGNOSIS — D259 Leiomyoma of uterus, unspecified: Secondary | ICD-10-CM | POA: Diagnosis not present

## 2023-04-12 LAB — URINALYSIS, ROUTINE W REFLEX MICROSCOPIC
Bilirubin Urine: NEGATIVE
Glucose, UA: NEGATIVE mg/dL
Ketones, ur: NEGATIVE mg/dL
Leukocytes,Ua: NEGATIVE
Nitrite: NEGATIVE
Protein, ur: NEGATIVE mg/dL
Specific Gravity, Urine: 1.01 (ref 1.005–1.030)
pH: 7 (ref 5.0–8.0)

## 2023-04-12 LAB — CBC
HCT: 37.1 % (ref 36.0–46.0)
Hemoglobin: 12.6 g/dL (ref 12.0–15.0)
MCH: 30 pg (ref 26.0–34.0)
MCHC: 34 g/dL (ref 30.0–36.0)
MCV: 88.3 fL (ref 80.0–100.0)
Platelets: 301 10*3/uL (ref 150–400)
RBC: 4.2 MIL/uL (ref 3.87–5.11)
RDW: 13.5 % (ref 11.5–15.5)
WBC: 9.4 10*3/uL (ref 4.0–10.5)
nRBC: 0 % (ref 0.0–0.2)

## 2023-04-12 LAB — COMPREHENSIVE METABOLIC PANEL
ALT: 17 U/L (ref 0–44)
AST: 21 U/L (ref 15–41)
Albumin: 3.6 g/dL (ref 3.5–5.0)
Alkaline Phosphatase: 65 U/L (ref 38–126)
Anion gap: 10 (ref 5–15)
BUN: 7 mg/dL (ref 6–20)
CO2: 23 mmol/L (ref 22–32)
Calcium: 9.2 mg/dL (ref 8.9–10.3)
Chloride: 103 mmol/L (ref 98–111)
Creatinine, Ser: 0.71 mg/dL (ref 0.44–1.00)
GFR, Estimated: >60 mL/min (ref >60)
Glucose, Bld: 109 mg/dL — ABNORMAL HIGH (ref 70–99)
Potassium: 3.7 mmol/L (ref 3.5–5.1)
Sodium: 136 mmol/L (ref 135–145)
Total Bilirubin: 0.9 mg/dL (ref 0.0–1.2)
Total Protein: 7.2 g/dL (ref 6.5–8.1)

## 2023-04-12 LAB — URINALYSIS, MICROSCOPIC (REFLEX)
Bacteria, UA: NONE SEEN
Squamous Epithelial / HPF: NONE SEEN /[HPF] (ref 0–5)
WBC, UA: NONE SEEN WBC/hpf (ref 0–5)

## 2023-04-12 LAB — RESP PANEL BY RT-PCR (RSV, FLU A&B, COVID)  RVPGX2
Influenza A by PCR: NEGATIVE
Influenza B by PCR: NEGATIVE
Resp Syncytial Virus by PCR: NEGATIVE
SARS Coronavirus 2 by RT PCR: NEGATIVE

## 2023-04-12 LAB — LIPASE, BLOOD: Lipase: 26 U/L (ref 11–51)

## 2023-04-12 LAB — HCG, SERUM, QUALITATIVE: Preg, Serum: NEGATIVE

## 2023-04-12 MED ORDER — IOHEXOL 350 MG/ML SOLN
75.0000 mL | Freq: Once | INTRAVENOUS | Status: AC | PRN
  Administered 2023-04-12: 75 mL via INTRAVENOUS

## 2023-04-12 MED ORDER — AMOXICILLIN-POT CLAVULANATE 875-125 MG PO TABS: 1.0000 | ORAL_TABLET | Freq: Two times a day (BID) | ORAL | 0 refills | Status: DC

## 2023-04-12 MED ORDER — ONDANSETRON 4 MG PO TBDP
4.0000 mg | ORAL_TABLET | Freq: Once | ORAL | Status: AC | PRN
  Administered 2023-04-12: 4 mg via ORAL
  Filled 2023-04-12: qty 1

## 2023-04-12 NOTE — ED Provider Notes (Signed)
[facility] EM[facility]GENC[facility] DEPARTMENT AT [facility] Provider Note   CSN: [id number] Arrival date & time: 04/12/23  1035     History  Chief Complaint  Patient presents with   Abdominal Pain    Gina Bauer is a 53 y.o. female.  53 year old female presents with several days of midepigastric pain now rating to her right lower quadrant.  She has had chills but no reported fever.  Does have some nausea and vomiting but denies any diarrhea.  Pain is characterized as sharp and worse with any movement.  Went to urgent care and sent here for further evaluation.  Denies any prior surgical history       Home Medications Prior to Admission medications   Medication Sig Start Date End Date Taking? Authorizing Provider  Cholecalciferol (VITAMIN D-3 PO) Take 1 tablet by mouth daily.    [provider]  cyanocobalamin 100 MCG tablet Take 100 mcg by mouth daily. 03/04/18   [provider]  estradiol (ESTRACE) 0.1 MG/GM vaginal cream Place 1 Applicatorful vaginally 3 (three) times a week. 01/15/22   [provider]  estradiol (VIVELLE-DOT) 0.0375 MG/24HR Place 1 patch onto the skin 2 (two) times a week. 07/27/22   [provider]  lansoprazole (PREVACID) 30 MG capsule Take 30 mg by mouth every morning. 03/24/22   [provider]  NONFORMULARY OR COMPOUNDED ITEM Testosterone 4% cream 09/04/21   [provider]  norgestimate-ethinyl estradiol (ESTARYLLA) 0.25-35 MG-MCG tablet TAKE 1 ACTIVE TABLET DAILY SKIPPING PLACEBO TABLET 10/23/21   Genia Del, MD  omeprazole (PRILOSEC) 20 MG capsule Take by mouth.    [provider]  progesterone (PROMETRIUM) 100 MG capsule Take 100 mg by mouth at bedtime. 07/27/22   [provider]  thyroid (ARMOUR) 15 MG tablet Take 15 mg by mouth daily. 12/08/21   [provider]  UNABLE TO FIND Med Name: lowest dose thyroid medication    [provider]      Allergies    Patient  has no known allergies.    Review of Systems   Review of Systems  All other systems reviewed and are negative.   Physical Exam Updated Vital Signs BP (!) 141/88   Pulse (!) 112   Temp 98.5 F (36.9 C) (Oral)   Resp 20   Ht 1.549 m (5\' 1" )   Wt 61.7 kg   LMP  (Approximate)   SpO2 100%   BMI 25.70 kg/m  Physical Exam Vitals and nursing note reviewed.  Constitutional:      General: She is not in acute distress.    Appearance: Normal appearance. She is well-developed. She is not toxic-appearing.  HENT:     Head: Normocephalic and atraumatic.  Eyes:     General: Lids are normal.     Conjunctiva/sclera: Conjunctivae normal.     Pupils: Pupils are equal, round, and reactive to light.  Neck:     Thyroid: No thyroid mass.     Trachea: No tracheal deviation.  Cardiovascular:     Rate and Rhythm: Normal rate and regular rhythm.     Heart sounds: Normal heart sounds. No murmur heard.    No gallop.  Pulmonary:     Effort: Pulmonary effort is normal. No respiratory distress.     Breath sounds: Normal breath sounds. No stridor. No decreased breath sounds, wheezing, rhonchi or rales.  Abdominal:     General: There is no distension.     Palpations: Abdomen is soft.  Tenderness: There is abdominal tenderness in the right upper quadrant and right lower quadrant. There is guarding. There is no rebound.    Musculoskeletal:        General: No tenderness. Normal range of motion.     Cervical back: Normal range of motion and neck supple.  Skin:    General: Skin is warm and dry.     Findings: No abrasion or rash.  Neurological:     Mental Status: She is alert and oriented to person, place, and time. Mental status is at baseline.     GCS: GCS eye subscore is 4. GCS verbal subscore is 5. GCS motor subscore is 6.     Cranial Nerves: No cranial nerve deficit.     Sensory: No sensory deficit.     Motor: Motor function is intact.  Psychiatric:        Attention and Perception:  Attention normal.        Speech: Speech normal.        Behavior: Behavior normal.     ED Results / Procedures / Treatments   Labs (all labs ordered are listed, but only abnormal results are displayed) Labs Reviewed  RESP PANEL BY RT-PCR (RSV, FLU A&B, COVID)  RVPGX2  CBC  LIPASE, BLOOD  COMPREHENSIVE METABOLIC PANEL  URINALYSIS, ROUTINE W REFLEX MICROSCOPIC  HCG, SERUM, QUALITATIVE    EKG None  Radiology No results found.  Procedures Procedures    Medications Ordered in ED Medications  ondansetron (ZOFRAN-ODT) disintegrating tablet 4 mg (4 mg Oral Given 04/12/23 1055)    ED Course/ Medical Decision Making/ A&P                                 Medical Decision Making Amount and/or Complexity of Data Reviewed Labs: ordered. Radiology: ordered.  Risk Prescription drug management.   Patient's labs here are reassuring.  Abdominal CT shows colitis no evidence of appendicitis.  Will place on antibiotics and patient follow with her GI doctor        Final Clinical Impression(s) / ED Diagnoses Final diagnoses:  None    Rx / DC Orders ED Discharge Orders     None         Lorre Nick, MD 04/12/23 1501

## 2023-04-12 NOTE — ED Triage Notes (Signed)
Pt c/o lower abdominal pain, more on right side than other, headache, diarrhea, nausea, fever, and chills for past 5 days. Pt was seen at [facility] and sent to ED for r/o appendicitis.

## 2023-04-12 NOTE — Discharge Instructions (Addendum)
Follow up with your GI doctor

## 2023-04-15 ENCOUNTER — Telehealth: Payer: Self-pay | Admitting: Gastroenterology

## 2023-04-15 ENCOUNTER — Other Ambulatory Visit: Payer: Self-pay | Admitting: Gastroenterology

## 2023-04-15 MED ORDER — LANSOPRAZOLE 30 MG PO CPDR: 30.0000 mg | DELAYED_RELEASE_CAPSULE | Freq: Every morning | ORAL | 1 refills | Status: DC

## 2023-04-15 NOTE — Telephone Encounter (Signed)
Requesting medication refill for Lansoprazole. Please advise.

## 2023-04-15 NOTE — Telephone Encounter (Signed)
Refill sent.

## 2023-04-16 ENCOUNTER — Telehealth: Payer: Self-pay | Admitting: Gastroenterology

## 2023-04-16 ENCOUNTER — Encounter: Payer: Self-pay | Admitting: Family Medicine

## 2023-04-16 NOTE — Telephone Encounter (Signed)
Patient called requesting to speak with a nurse stating she is having discomfort on her side. Patient is scheduled for 05/13/2023.

## 2023-04-19 NOTE — Telephone Encounter (Signed)
Left message on machine to call back

## 2023-04-20 NOTE — Telephone Encounter (Signed)
Patient returned your call, please advise.

## 2023-04-21 NOTE — Telephone Encounter (Signed)
Left message on machine to call back

## 2023-04-22 NOTE — Telephone Encounter (Signed)
Left message on machine to call back  I have been unable to reach the pt by phone. Will await further communication from the pt.

## 2023-04-23 DIAGNOSIS — F4322 Adjustment disorder with anxiety: Secondary | ICD-10-CM | POA: Diagnosis not present

## 2023-04-27 ENCOUNTER — Ambulatory Visit (INDEPENDENT_AMBULATORY_CARE_PROVIDER_SITE_OTHER)
Admission: RE | Admit: 2023-04-27 | Discharge: 2023-04-27 | Disposition: A | Discharge status: Home / Self Care | Payer: BC Managed Care – PPO | Source: Ambulatory Visit | Attending: Gastroenterology | Admitting: Gastroenterology

## 2023-04-27 ENCOUNTER — Ambulatory Visit: Payer: BC Managed Care – PPO | Admitting: Gastroenterology

## 2023-04-27 ENCOUNTER — Encounter: Payer: Self-pay | Admitting: Gastroenterology

## 2023-04-27 VITALS — BP 96/78 | HR 76 | Ht 61.0 in | Wt 137.0 lb

## 2023-04-27 DIAGNOSIS — R194 Change in bowel habit: Secondary | ICD-10-CM

## 2023-04-27 DIAGNOSIS — R1031 Right lower quadrant pain: Secondary | ICD-10-CM

## 2023-04-27 DIAGNOSIS — Z8719 Personal history of other diseases of the digestive system: Secondary | ICD-10-CM

## 2023-04-27 DIAGNOSIS — K529 Noninfective gastroenteritis and colitis, unspecified: Secondary | ICD-10-CM | POA: Diagnosis not present

## 2023-04-27 DIAGNOSIS — R109 Unspecified abdominal pain: Secondary | ICD-10-CM | POA: Diagnosis not present

## 2023-04-27 DIAGNOSIS — K5900 Constipation, unspecified: Secondary | ICD-10-CM | POA: Diagnosis not present

## 2023-04-27 NOTE — Progress Notes (Addendum)
 Chief Complaint: abdominal pain, history of colitis Primary GI Doctor: (previously Dr. Eda) Dr. Suzann   HPI: Patient is a 53 year old female patient with past medical history of anxiety, hypothyroidism,and GERD who presents today for follow-up with main complaint of   Patient was last seen by Dr. Eda on 06/24/2020 for complaint of right upper quadrant/right flank pain in the setting of meloxicam use.  Pain was not explained by ultrasound, CT, EGD or colonoscopy.  HIDA scan was ordered which was normal. She recommended she continue Lansoprazole  30 mg BID and avoid NSAID's.  Patient was seen in the ED on 04/12/2023 for complaints of midepigastric pain that radiated to her right lower quadrant.  Patient also had nausea with vomiting.  Labs showed normal lipase, normal LFTs and electrolytes, WBC 9.4, hemoglobin 12.6. CT scan showed wall thickening of the transverse, descending, and sigmoid colon compatible with nonspecific colitis.  Patient did not have any diarrhea at the ED visit, but was prescribed antibiotics Augmentin  1 tab BID for 7 days.  Interval History     Patient presents for follow-up with complaint of RLQ/midline abdominal pain, accompanied by her partner. Patient recently traveled to [place] for two weeks prior to ED visit. She reports she went to ER on 1/20 for abdominal pain, nausea and vomiting. She was told she had colitis and prescribed antibiotics which she completed. She states initially she started to feel better on the antibiotics but then her symptoms returned. She describes the pain as sharp and crampy. She also has had intermittent chills since yesterday. She complains of extreme fatigue. No nausea or vomiting. Patient reports her normal routine is having one bowel movement daily. Occasional constipation, cannot recall if she had constipation prior to this ED visit. She had one or two loose stools yesterday followed by constipation today.  She took a dose of Smooth move tea.  No rectal bleeding. Patient also admits during her [place] trip she was taking more NSAID's because she was on her feet. She took OTC ibuprofen 400 mg po most days. Patient has history of GERD and currently taking lansoprazole  30mg  once daily which controls her symptoms. If she skips a dose she has nausea. Patient denies dysphagia. Socially drinks. Nonsmoker. No new medications. Patient's family history includes mother with GERD.  Wt Readings from Last 3 Encounters:  04/27/23 137 lb (62.1 kg)  04/12/23 136 lb (61.7 kg)  08/13/22 139 lb 6.4 oz (63.2 kg)    Past Medical History:  Diagnosis Date   Anemia    Anxiety    Bladder disorder    Colitis    CRP elevated 01/26/2022   Hypothyroid    Past Surgical History:  Procedure Laterality Date   DENTAL SURGERY     MOUTH SURGERY     WISDOM TOOTH EXTRACTION     Current Outpatient Medications  Medication Sig Dispense Refill   amoxicillin -clavulanate (AUGMENTIN ) 875-125 MG tablet Take 1 tablet by mouth every 12 (twelve) hours. 14 tablet 0   estradiol  (ESTRACE ) 0.1 MG/GM vaginal cream Place 1 Applicatorful vaginally 3 (three) times a week.     estradiol  (VIVELLE -DOT) 0.0375 MG/24HR Place 1 patch onto the skin 2 (two) times a week.     lansoprazole  (PREVACID ) 30 MG capsule Take 1 capsule (30 mg total) by mouth every morning. 30 capsule 1   NONFORMULARY OR COMPOUNDED ITEM Testosterone  4% cream     progesterone (PROMETRIUM) 100 MG capsule Take 100 mg by mouth at bedtime.  thyroid  (ARMOUR) 15 MG tablet Take 15 mg by mouth daily.     Cholecalciferol (VITAMIN D-3 PO) Take 1 tablet by mouth daily. (Patient not taking: Reported on 04/27/2023)     cyanocobalamin  100 MCG tablet Take 100 mcg by mouth daily. (Patient not taking: Reported on 04/27/2023)     norgestimate -ethinyl estradiol  (ESTARYLLA) 0.25-35 MG-MCG tablet TAKE 1 ACTIVE TABLET DAILY SKIPPING PLACEBO TABLET (Patient not taking: Reported on 04/27/2023) 112 tablet 4   UNABLE TO FIND Med Name: lowest  dose thyroid  medication (Patient not taking: Reported on 04/27/2023)     No current facility-administered medications for this visit.   Allergies as of 04/27/2023   (No Known Allergies)   Family History  Problem Relation Age of Onset   Breast cancer Mother    Diabetes Mother    Heart disease Mother    Heart failure Mother    Valvular heart disease Mother    Colon polyps Father    Prostate cancer Father    Alzheimer's disease Father    Heart attack Father    Breast cancer Maternal Grandmother    Rheum arthritis Maternal Grandmother    Leukemia Maternal Grandfather    Stomach cancer Neg Hx    Esophageal cancer Neg Hx    Pancreatic cancer Neg Hx    Review of Systems:    Constitutional: No weight loss, fever, chills, weakness or fatigue HEENT: Eyes: No change in vision               Ears, Nose, Throat:  No change in hearing or congestion Skin: No rash or itching Cardiovascular: No chest pain, chest pressure or palpitations   Respiratory: No SOB or cough Gastrointestinal: See HPI and otherwise negative Genitourinary: No dysuria or change in urinary frequency Neurological: No headache, dizziness or syncope Musculoskeletal: No new muscle or joint pain Hematologic: No bleeding or bruising Psychiatric: No history of depression or anxiety   Physical Exam:  Vital signs: BP 96/78   Pulse 76   Ht 5' 1 (1.549 m)   Wt 137 lb (62.1 kg)   LMP  (Approximate)   BMI 25.89 kg/m   Constitutional:   Pleasant Caucasian female appears to be in NAD, Well developed, Well nourished, alert and cooperative Neck:  Supple Throat: Oral cavity and pharynx without inflammation, swelling or lesion.  Respiratory: Respirations even and unlabored. Lungs clear to auscultation bilaterally.   No wheezes, crackles, or rhonchi.  Cardiovascular: Normal S1, S2. Regular rate and rhythm. No peripheral edema, cyanosis or pallor.  Gastrointestinal:  Soft, nondistended, generalized lower abdominal tenderness with  palpation. No rebound or guarding. Normal bowel sounds. No appreciable masses or hepatomegaly. Rectal:  Not performed.  Skin:   Dry and intact without significant lesions or rashes. Psychiatric: Oriented to person, place and time. Demonstrates good judgement and reason without abnormal affect or behaviors.  RELEVANT LABS AND IMAGING: CBC    Latest Ref Rng & Units 04/12/2023   10:54 AM 12/25/2019    4:02 PM 06/23/2018   12:54 PM  CBC  WBC 4.0 - 10.5 K/uL 9.4  8.0  7.6   Hemoglobin 12.0 - 15.0 g/dL 87.3  87.9  89.4   Hematocrit 36.0 - 46.0 % 37.1  36.0  32.2   Platelets 150 - 400 K/uL 301  251  328     CMP     Latest Ref Rng & Units 04/12/2023   10:54 AM 02/23/2020   10:47 AM 12/25/2019    4:02 PM  CMP  Glucose 70 - 99 mg/dL 890   88   BUN 6 - 20 mg/dL 7  11  8    Creatinine 0.44 - 1.00 mg/dL 9.28  9.31  9.37   Sodium 135 - 145 mmol/L 136   141   Potassium 3.5 - 5.1 mmol/L 3.7   3.4   Chloride 98 - 111 mmol/L 103   106   CO2 22 - 32 mmol/L 23   24   Calcium 8.9 - 10.3 mg/dL 9.2   9.3   Total Protein 6.5 - 8.1 g/dL 7.2   7.3   Total Bilirubin 0.0 - 1.2 mg/dL 0.9   0.7   Alkaline Phos 38 - 126 U/L 65   37   AST 15 - 41 U/L 21   19   ALT 0 - 44 U/L 17   18     Lab Results  Component Value Date   TSH 2.85 06/23/2018   Lab Results  Component Value Date   LIPASE 26 04/12/2023  12/10/22 labs show: CRP 19.8 04/11/20 colonoscopy Impression:  - Non- bleeding internal hemorrhoids.  - Diverticulosis in the sigmoid colon, in the descending colon and in the ascending colon.  - The examination was otherwise normal on direct and retroflexion views.  - No specimens collected. 04/11/20 EGD Impression:  - Z- line irregular, 37 cm from the incisors. Biopsied.  - Normal stomach. Biopsied.  - Normal examined duodenum. Biopsied.  - The examination was otherwise normal. Path: Diagnosis 1. Surgical [P], gastric - REACTIVE GASTROPATHY - NO H. PYLORI OR INTESTINAL METAPLASIA IDENTIFIED - SEE  COMMENT 2. Surgical [P], duodenal - BENIGN DUODENAL MUCOSA - NO ACUTE INFLAMMATION, VILLOUS BLUNTING OR INCREASED INTRAEPITHELIAL LYMPHOCYTES IDENTIFIED 3. Surgical [P], esophageal irregular z line at 37 - REACTIVE SQUAMOCOLUMNAR JUNCTION WITH CHRONIC INFLAMMATION - NO INTESTINAL METAPLASIA, DYSPLASIA OR MALIGNANCY IDENTIFIED  04/12/23 CT ABD/pelvis with contrast IMPRESSION: 1. Wall thickening of the transverse, descending, and sigmoid colon, compatible with a nonspecific colitis. 2. Fibroid uterus. 3. Additional unchanged chronic findings, as described above.  09/13/2020 HIDA scan IMPRESSION: Normal hepatobiliary scan and gallbladder ejection fraction 09/13/2020 MR abdomen with and without contrast to follow-up on liver lesion IMPRESSION: 1. Stable segment VI/VII cystic lesion with multiple thin septations appearing to arise from macro lobulations. Without discrete suspicious enhancing mural nodularity, wall irregularity, and no septations arising from microlobulations visualized. 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 No solid enhancing hepatic lesions. 2. Renal sinus cyst in the lower pole of the right kidney. 03/01/2020 MRI abdomen with and without contrast IMPRESSION: 1. 1.6 cm multi septated cystic lesion in the peripheral right liver. Given the lack of cancer history, this is likely benign. Follow-up MRI in 3-6 months recommended to ensure stability. 2. Central sinus cyst lower pole right kidney. 12/25/2019 CT Abd/pelvis with contrast IMPRESSION: No renal or collecting system calculi.  Large heterogeneously enhancing uterine fibroid. Moderate amount of colonic stool.   Assessment: Encounter Diagnoses  Name Primary?   History of colitis Yes   RLQ abdominal pain    Altered bowel habits        53 year old female patient that presents after recent ED visit with continuous symptoms of RLQ/midline abdominal pain. Recent CMP,CBC normal. Normal lipase.  1/20 CT scan showed colitis and patient was prescribed 7 days of antibiotics which she completed without improvement. She does not have symptoms of diarrhea or fever today. Not sure stool studies would provide any information at this point with her  current symptoms. She complains of constipation today. Will order Abd xray 2 view to rule out large stool burden. If positive for constipation will do a bowel purge and start her on daily Miralax. Ischemic colitis is often associated with constipation and would like to rule out. We also discussed the possibility of doing colonoscopy in near future to obtain biopsies to rule out inflammatory disease if no improvement in symptoms. Educated on low residue diet until symptoms improved. Discussed prescribing medications to help with the pain, however often times can cause constipation, pt would like to hold off at this time.  Plan: - Recommend Low-residue diet - Order Abdominal 2 view xray today -If negative xray with discuss with Dr. Suzann and possible colonoscopy  Thank you for the courtesy of this consult. Please call me with any questions or concerns.   Onetta Spainhower, FNP-C [facility] 04/27/2023, 4:26 PM  Cc: Aletha Bene, MD  I have reviewed the clinic note as outlined by Cathryne Beal, NP and agree with the assessment, plan and medical decision making.  Ms. Celaya was previously followed by Dr. Eda and has had a history of chronic right lower quadrant/midline abdominal pain for which previous evaluation including EGD, colonoscopy, CT, ultrasound and HIDA scan in 2022 were unremarkable.  Symptoms seem to be consistent with functional bowel disease.  Recently developed worsening abdominal pain and diarrhea after a trip to [place].  She proceeded to the ED and CT scan 04/12/2023 showed concern for colitis treated with antibiotics.  Initially improved but then had recurrent symptoms.  I agree with the evaluation outlined by Cathryne Beal, NP.  If  abdominal imaging is unremarkable can consider updated colonoscopy.  Inocente Suzann, MD

## 2023-04-27 NOTE — Patient Instructions (Signed)
 Your provider has requested that you have an abdominal x ray before leaving today. Please go to the basement floor to our Radiology department for the test.  _______________________________________________________  If your blood pressure at your visit was 140/90 or greater, please contact your primary care physician to follow up on this.  _______________________________________________________  If you are age 54 or older, your body mass index should be between 23-30. Your There is no height or weight on file to calculate BMI. If this is out of the aforementioned range listed, please consider follow up with your Primary Care Provider.  If you are age 45 or younger, your body mass index should be between 19-25. Your There is no height or weight on file to calculate BMI. If this is out of the aformentioned range listed, please consider follow up with your Primary Care Provider.   ________________________________________________________  The [facility] providers would like to encourage you to use MYCHART to communicate with providers for non-urgent requests or questions.  Due to long hold times on the telephone, sending your provider a message by [facility] may be a faster and more efficient way to get a response.  Please allow 48 business hours for a response.  Please remember that this is for non-urgent requests.  _______________________________________________________

## 2023-04-30 DIAGNOSIS — F4322 Adjustment disorder with anxiety: Secondary | ICD-10-CM | POA: Diagnosis not present

## 2023-05-13 ENCOUNTER — Ambulatory Visit: Payer: BC Managed Care – PPO | Admitting: Gastroenterology

## 2023-05-21 DIAGNOSIS — F4322 Adjustment disorder with anxiety: Secondary | ICD-10-CM | POA: Diagnosis not present

## 2023-05-24 MED ORDER — LANSOPRAZOLE 30 MG PO CPDR: 30.0000 mg | DELAYED_RELEASE_CAPSULE | Freq: Every morning | ORAL | 1 refills | Status: DC

## 2023-05-28 DIAGNOSIS — N951 Menopausal and female climacteric states: Secondary | ICD-10-CM | POA: Diagnosis not present

## 2023-05-28 DIAGNOSIS — L700 Acne vulgaris: Secondary | ICD-10-CM | POA: Diagnosis not present

## 2023-06-17 NOTE — Progress Notes (Signed)
 Chief Complaint:abdominal pain, history of colitis  Primary GI Doctor:(previously Dr. Orvan Falconer) Dr. Doy Hutching   HPI: Patient is a 53 year old female patient with past medical history of anxiety, hypothyroidism,and GERD who presents today for follow-up with main complaint of    Patient was last seen by Dr. Orvan Falconer on 06/24/2020 for complaint of right upper quadrant/right flank pain in the setting of meloxicam use.  Pain was not explained by ultrasound, CT, EGD or colonoscopy.  HIDA scan was ordered which was normal. She recommended she continue Lansoprazole 30 mg BID and avoid NSAID's.   Patient was seen in the ED on 04/12/2023 for complaints of midepigastric pain that radiated to her right lower quadrant.  Patient also had nausea with vomiting.  Labs showed normal lipase, normal LFTs and electrolytes, WBC 9.4, hemoglobin 12.6. CT scan showed wall thickening of the transverse, descending, and sigmoid colon compatible with nonspecific colitis.  Patient did not have any diarrhea at the ED visit, but was prescribed antibiotics Augmentin 1 tab BID for 7 days.   04/27/23 seen in GI clinic by myself with main complaint of right upper lower quadrant /midline abdominal pain.  Normal lab work.  1/20 CT showed colitis and patient prescribed 7 days of antibiotics.  Patient with recent issues of constipation ordered abdominal x-ray appointment. Abd xray 2 view-  Normal nonobstructive bowel gas pattern with scattered stool in the colon. PT instructed to take Miralax po daily.  Reports via MyChart she is doing better.  Interval History   Patient presents for follow-up. She states she is doing very well. She is currently only taking probiotics, prune juice, and uses OTC Miralax as needed. This regimen has worked well for her and regulated her bowels. No abdominal pain or blood in stool. She is planning a trip to [place] in a few weeks and enquires about what to take with her.     Patient has history of GERD and currently  taking lansoprazole 30mg  once daily which controls her symptoms. If she skips a dose she has nausea. Patient denies dysphagia.    Wt Readings from Last 3 Encounters:  06/21/23 134 lb 8 oz (61 kg)  04/27/23 137 lb (62.1 kg)  04/12/23 136 lb (61.7 kg)    Past Medical History:  Diagnosis Date   Anemia    Anxiety    Bladder disorder    Colitis    CRP elevated 01/26/2022   Hypothyroid     Past Surgical History:  Procedure Laterality Date   DENTAL SURGERY     MOUTH SURGERY     WISDOM TOOTH EXTRACTION      Current Outpatient Medications  Medication Sig Dispense Refill   Cholecalciferol (VITAMIN D-3 PO) Take 1 tablet by mouth daily.     cyanocobalamin 100 MCG tablet Take 100 mcg by mouth daily.     estradiol (ESTRACE) 0.1 MG/GM vaginal cream Place 1 Applicatorful vaginally 3 (three) times a week.     estradiol (VIVELLE-DOT) 0.05 MG/24HR patch Place 1 patch onto the skin 2 (two) times a week.     NONFORMULARY OR COMPOUNDED ITEM Testosterone 4% cream     NP THYROID 30 MG tablet Take 30 mg by mouth daily.     progesterone (PROMETRIUM) 100 MG capsule Take 100 mg by mouth at bedtime.     spironolactone (ALDACTONE) 50 MG tablet Take 50 mg by mouth daily.     lansoprazole (PREVACID) 30 MG capsule Take 1 capsule (30 mg total) by mouth every  morning. 30 capsule 1   No current facility-administered medications for this visit.    Allergies as of 06/21/2023   (No Known Allergies)    Family History  Problem Relation Age of Onset   Breast cancer Mother    Diabetes Mother    Heart disease Mother    Heart failure Mother    Valvular heart disease Mother    Colon polyps Father    Prostate cancer Father    Alzheimer's disease Father    Heart attack Father    Breast cancer Maternal Grandmother    Rheum arthritis Maternal Grandmother    Leukemia Maternal Grandfather    Stomach cancer Neg Hx    Esophageal cancer Neg Hx    Pancreatic cancer Neg Hx     Review of Systems:     Constitutional: No weight loss, fever, chills, weakness or fatigue HEENT: Eyes: No change in vision               Ears, Nose, Throat:  No change in hearing or congestion Skin: No rash or itching Cardiovascular: No chest pain, chest pressure or palpitations   Respiratory: No SOB or cough Gastrointestinal: See HPI and otherwise negative Genitourinary: No dysuria or change in urinary frequency Neurological: No headache, dizziness or syncope Musculoskeletal: No new muscle or joint pain Hematologic: No bleeding or bruising Psychiatric: No history of depression or anxiety    Physical Exam:  Vital signs: BP 90/70 (BP Location: Left Arm, Patient Position: Sitting, Cuff Size: Normal)   Pulse 80   Ht 5\' 1"  (1.549 m)   Wt 134 lb 8 oz (61 kg)   BMI 25.41 kg/m   Constitutional:   Pleasant female appears to be in NAD, Well developed, Well nourished, alert and cooperative Throat: Oral cavity and pharynx without inflammation, swelling or lesion.  Respiratory: Respirations even and unlabored. Lungs clear to auscultation bilaterally.   No wheezes, crackles, or rhonchi.  Cardiovascular: Normal S1, S2. Regular rate and rhythm. No peripheral edema, cyanosis or pallor.  Gastrointestinal:  Soft, nondistended, nontender. No rebound or guarding. Normal bowel sounds. No appreciable masses or hepatomegaly. Rectal:  Not performed.  Msk:  Symmetrical without gross deformities. Without edema, no deformity or joint abnormality.  Neurologic:  Alert and  oriented x4;  grossly normal neurologically.  Skin:   Dry and intact without significant lesions or rashes. Psychiatric: Oriented to person, place and time. Demonstrates good judgement and reason without abnormal affect or behaviors.  RELEVANT LABS AND IMAGING: CBC    Latest Ref Rng & Units 04/12/2023   10:54 AM 12/25/2019    4:02 PM 06/23/2018   12:54 PM  CBC  WBC 4.0 - 10.5 K/uL 9.4  8.0  7.6   Hemoglobin 12.0 - 15.0 g/dL 16.1  09.6  04.5   Hematocrit  36.0 - 46.0 % 37.1  36.0  32.2   Platelets 150 - 400 K/uL 301  251  328      CMP     Latest Ref Rng & Units 04/12/2023   10:54 AM 02/23/2020   10:47 AM 12/25/2019    4:02 PM  CMP  Glucose 70 - 99 mg/dL 409   88   BUN 6 - 20 mg/dL 7  11  8    Creatinine 0.44 - 1.00 mg/dL 8.11  9.14  7.82   Sodium 135 - 145 mmol/L 136   141   Potassium 3.5 - 5.1 mmol/L 3.7   3.4   Chloride 98 - 111 mmol/L 103  106   CO2 22 - 32 mmol/L 23   24   Calcium 8.9 - 10.3 mg/dL 9.2   9.3   Total Protein 6.5 - 8.1 g/dL 7.2   7.3   Total Bilirubin 0.0 - 1.2 mg/dL 0.9   0.7   Alkaline Phos 38 - 126 U/L 65   37   AST 15 - 41 U/L 21   19   ALT 0 - 44 U/L 17   18      Lab Results  Component Value Date   TSH 2.85 06/23/2018   12/10/22 labs show: CRP 19.8 04/11/20 colonoscopy with Dr. Orvan Falconer, recall 10 years Impression:  - Non- bleeding internal hemorrhoids.  - Diverticulosis in the sigmoid colon, in the descending colon and in the ascending colon.  - The examination was otherwise normal on direct and retroflexion views.  - No specimens collected. 04/11/20 EGD Impression:  - Z- line irregular, 37 cm from the incisors. Biopsied.  - Normal stomach. Biopsied.  - Normal examined duodenum. Biopsied.  - The examination was otherwise normal. Path: Diagnosis 1. Surgical [P], gastric - REACTIVE GASTROPATHY - NO H. PYLORI OR INTESTINAL METAPLASIA IDENTIFIED - SEE COMMENT 2. Surgical [P], duodenal - BENIGN DUODENAL MUCOSA - NO ACUTE INFLAMMATION, VILLOUS BLUNTING OR INCREASED INTRAEPITHELIAL LYMPHOCYTES IDENTIFIED 3. Surgical [P], esophageal irregular z line at 37 - REACTIVE SQUAMOCOLUMNAR JUNCTION WITH CHRONIC INFLAMMATION - NO INTESTINAL METAPLASIA, DYSPLASIA OR MALIGNANCY IDENTIFIED   04/12/23 CT ABD/pelvis with contrast IMPRESSION: 1. Wall thickening of the transverse, descending, and sigmoid colon, compatible with a nonspecific colitis. 2. Fibroid uterus. 3. Additional unchanged chronic findings, as  described above.   09/13/2020 HIDA scan IMPRESSION: Normal hepatobiliary scan and gallbladder ejection fraction 09/13/2020 MR abdomen with and without contrast to follow-up on liver lesion IMPRESSION: 1. Stable segment VI/VII cystic lesion with multiple thin septations appearing to arise from macro lobulations. Without discrete suspicious enhancing mural nodularity, wall irregularity, and no septations arising from microlobulations visualized. 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 No solid enhancing hepatic lesions. 2. Renal sinus cyst in the lower pole of the right kidney. 03/01/2020 MRI abdomen with and without contrast IMPRESSION: 1. 1.6 cm multi septated cystic lesion in the peripheral right liver. Given the lack of cancer history, this is likely benign. Follow-up MRI in 3-6 months recommended to ensure stability. 2. Central sinus cyst lower pole right kidney. 12/25/2019 CT Abd/pelvis with contrast IMPRESSION: No renal or collecting system calculi.  Large heterogeneously enhancing uterine fibroid. Moderate amount of colonic stool.   Assessment: Encounter Diagnoses  Name Primary?   Altered bowel habits Yes   Gastroesophageal reflux disease, unspecified whether esophagitis present    53 year old female patient who presents to follow-up on RLQ abdominal pain with altered bowel habits. Abd KUB after last appointment revealed constipation. Since then we added Miralax po daily , which she states she is only taking prn along with prune juice and probiotic. Overall, she is doing well. Reinforced importance of following strict regimen on trip and doing daily fiber supplementation with Miralax prn. Nonew issues. She is UTD on colonoscopy.    Additionally she has GERD and currently taking Lansoprazole 30 mg po daily, and enquires about decreasing dosage after she returns from our trip. We will do this when she returns and decrease to 15mg .  Plan: -Continue Miralax po  daily prn -Continue Lansoprazole 30 mg po daily, refill. She would like to try to decrease dosage when she returns from trip. -  recall colonoscopy 03/2030  -Follow-up prn  Thank you for the courtesy of this consult. Please call me with any questions or concerns.   Mackey Varricchio, FNP-C [facility] 06/21/2023, 9:19 AM  Cc: Bernadette Hoit, MD  I have reviewed the clinic note as outlined by Charmaine Downs, NP and agree with the assessment, plan and medical decision making.  Ms. Dimitri returns to the office for follow-up of right lower quadrant abdominal pain and change in bowel habits.  She was previously diagnosed with possible colitis managed with antibiotics.  On subsequent evaluation there was consideration of constipation contributing to symptoms.  She is faring better with the use of MiraLAX which has now regulated her bowels.  GERD appears stable on lansoprazole which can be continued.  Maren Beach, MD

## 2023-06-21 ENCOUNTER — Ambulatory Visit: Payer: BC Managed Care – PPO | Admitting: Gastroenterology

## 2023-06-21 ENCOUNTER — Other Ambulatory Visit: Payer: Self-pay | Admitting: Gastroenterology

## 2023-06-21 ENCOUNTER — Encounter: Payer: Self-pay | Admitting: Gastroenterology

## 2023-06-21 VITALS — BP 90/70 | HR 80 | Ht 61.0 in | Wt 134.5 lb

## 2023-06-21 DIAGNOSIS — R194 Change in bowel habit: Secondary | ICD-10-CM

## 2023-06-21 DIAGNOSIS — K219 Gastro-esophageal reflux disease without esophagitis: Secondary | ICD-10-CM

## 2023-06-21 DIAGNOSIS — R1031 Right lower quadrant pain: Secondary | ICD-10-CM | POA: Diagnosis not present

## 2023-06-21 MED ORDER — LANSOPRAZOLE 30 MG PO CPDR: 30.0000 mg | DELAYED_RELEASE_CAPSULE | Freq: Every morning | ORAL | 1 refills | Status: DC

## 2023-06-21 NOTE — Patient Instructions (Addendum)
 Continue Miralax po daily as needed for constipation.  Continue Lansoprazole 30 mg po daily, refill sent.  Please contact office via Mychart when you would like to decrease the Lansoprazole.  Thank you for trusting me with your gastrointestinal care!   Deanna May, NP     _______________________________________________________  If your blood pressure at your visit was 140/90 or greater, please contact your primary care physician to follow up on this.  _______________________________________________________  If you are age 19 or older, your body mass index should be between 23-30. Your Body mass index is 25.41 kg/m. If this is out of the aforementioned range listed, please consider follow up with your Primary Care Provider.  If you are age 32 or younger, your body mass index should be between 19-25. Your Body mass index is 25.41 kg/m. If this is out of the aformentioned range listed, please consider follow up with your Primary Care Provider.   ________________________________________________________  The [facility] providers would like to encourage you to use [facility] to communicate with providers for non-urgent requests or questions.  Due to long hold times on the telephone, sending your provider a message by [facility] may be a faster and more efficient way to get a response.  Please allow 48 business hours for a response.  Please remember that this is for non-urgent requests.  _______________________________________________________

## 2023-06-29 ENCOUNTER — Encounter: Payer: Self-pay | Admitting: Family Medicine

## 2023-06-29 DIAGNOSIS — R197 Diarrhea, unspecified: Secondary | ICD-10-CM | POA: Diagnosis not present

## 2023-06-29 DIAGNOSIS — R52 Pain, unspecified: Secondary | ICD-10-CM | POA: Diagnosis not present

## 2023-06-29 DIAGNOSIS — B349 Viral infection, unspecified: Secondary | ICD-10-CM | POA: Diagnosis not present

## 2023-06-29 DIAGNOSIS — R61 Generalized hyperhidrosis: Secondary | ICD-10-CM | POA: Diagnosis not present

## 2023-06-29 DIAGNOSIS — R509 Fever, unspecified: Secondary | ICD-10-CM | POA: Diagnosis not present

## 2023-08-06 DIAGNOSIS — F4322 Adjustment disorder with anxiety: Secondary | ICD-10-CM | POA: Diagnosis not present

## 2023-08-11 ENCOUNTER — Other Ambulatory Visit: Payer: Self-pay

## 2023-08-11 DIAGNOSIS — K219 Gastro-esophageal reflux disease without esophagitis: Secondary | ICD-10-CM

## 2023-08-11 MED ORDER — LANSOPRAZOLE 30 MG PO CPDR: 30.0000 mg | DELAYED_RELEASE_CAPSULE | Freq: Every day | ORAL | 3 refills | Status: DC

## 2023-08-11 NOTE — Progress Notes (Signed)
 Med refill

## 2023-09-23 ENCOUNTER — Other Ambulatory Visit: Payer: Self-pay | Admitting: Gastroenterology

## 2023-09-23 DIAGNOSIS — K219 Gastro-esophageal reflux disease without esophagitis: Secondary | ICD-10-CM

## 2023-11-07 DIAGNOSIS — R051 Acute cough: Secondary | ICD-10-CM | POA: Diagnosis not present

## 2023-11-07 DIAGNOSIS — R0982 Postnasal drip: Secondary | ICD-10-CM | POA: Diagnosis not present

## 2023-11-07 DIAGNOSIS — J3489 Other specified disorders of nose and nasal sinuses: Secondary | ICD-10-CM | POA: Diagnosis not present

## 2023-11-07 DIAGNOSIS — R0981 Nasal congestion: Secondary | ICD-10-CM | POA: Diagnosis not present

## 2023-12-17 ENCOUNTER — Ambulatory Visit: Payer: BC Managed Care – PPO | Admitting: Family Medicine

## 2023-12-20 ENCOUNTER — Encounter: Payer: Self-pay | Admitting: Family Medicine

## 2023-12-20 ENCOUNTER — Ambulatory Visit: Payer: BC Managed Care – PPO | Admitting: Family Medicine

## 2023-12-20 VITALS — BP 122/84 | HR 68 | Temp 98.1°F | Ht 61.0 in | Wt 137.0 lb

## 2023-12-20 DIAGNOSIS — Z780 Asymptomatic menopausal state: Secondary | ICD-10-CM

## 2023-12-20 DIAGNOSIS — R7982 Elevated C-reactive protein (CRP): Secondary | ICD-10-CM | POA: Diagnosis not present

## 2023-12-20 DIAGNOSIS — Z0000 Encounter for general adult medical examination without abnormal findings: Secondary | ICD-10-CM | POA: Diagnosis not present

## 2023-12-20 DIAGNOSIS — Z0001 Encounter for general adult medical examination with abnormal findings: Secondary | ICD-10-CM | POA: Diagnosis not present

## 2023-12-20 DIAGNOSIS — E038 Other specified hypothyroidism: Secondary | ICD-10-CM | POA: Diagnosis not present

## 2023-12-20 DIAGNOSIS — Z8261 Family history of arthritis: Secondary | ICD-10-CM | POA: Diagnosis not present

## 2023-12-20 DIAGNOSIS — Z23 Encounter for immunization: Secondary | ICD-10-CM

## 2023-12-20 NOTE — Progress Notes (Signed)
 Complete physical exam  Assessment & Plan:    Routine Health Maintenance and Physical Exam Discussed health benefits of physical activity, and encouraged her to engage in regular exercise appropriate for her age and condition.  Preventative health care -     CBC -     TSH -     Lipid panel -     Comprehensive metabolic panel with GFR  Immunization due -     Pneumococcal conjugate vaccine 20-valent  Postmenopausal  Elevated C-reactive protein (CRP) -     C-reactive protein -     Cyclic citrul peptide antibody, IgG -     ANA -     Rheumatoid factor -     Sedimentation rate  Family history of rheumatoid arthritis   Test results were reviewed and analyzed as part of the medical decision making of this visit.  Reviewed previous notes from [facility], cardiology notes and previous lab work done.  Pneumococcal vaccine given today.  Follow-up on lab work and notify patient. Recommend healthy diet i.e mediterranean/DASH diet, consistent exercise - 30 minutes 5 day per week, and gradual weight loss.  Return if symptoms worsen or fail to improve.        Subjective:  Patient ID: Gina Bauer, female    DOB: 1970-04-24  Age: 53 y.o. MRN: [id number] Chief Complaint  Patient presents with   Annual Exam   Establish Care    Gina Bauer is a 53 y.o. female who presents today for a complete physical exam. Colonoscopy-[facility] Dr[facility]  DEXA scan-not yet, normal per OB-GYN Tetanus- 2022 Shingrix-UTD- has received both shots STI- no concerns re this. Patient was checked for HIV and hepatitis with previous pregnancies.  Gina Bauer is a 53 year old female who presents for a routine follow-up visit, complete physical exam and establish primary care here.   Menopausal - She has been on hormone therapy for two years and continues to take thyroid  medication, initially prescribed by a previous doctor and continued by her current OB-GYN  physician Dr. Raguel in [place] at [facility], with no reported issues.  She will get pneumococcal vaccine today. She plans to get her flu shot in the coming month.  Her exercise routine includes yoga, walking, and she has recently started working with a trainer once a week. She takes calcium and vitamin D supplements for bone health.  Her last colonoscopy in 2022 showed no polyps. A bone density test two years ago was normal, and her mammogram is up to date as of January.  She has a family history of Alzheimer's disease, as her father had the condition. She focuses on exercise and nutrition to mitigate risks. her grandmother had rheumatoid arthritis so she worries about having autoimmune disorder and would like to have a panel done today. Her CRP was elevated last year.  She did see Dr. Raford cardiology in [place] who ordered calcium score which was zero. Patient's mom had rheumatic fever so patient does not have a FH of CAD.  Family history of rheumatoid arthritis- patient has concerns re this and would like to be checked. Not having any hand pain or stiffness at this time.   She consumes alcohol infrequently, about once a month. No concerns about HIV or substance use. Physical Exam Results RADIOLOGY Bone density: Normal (2023)  DIAGNOSTIC Colonoscopy: No polyps (2022) Assessment & Plan Menopausal hormone therapy Stable on therapy for 2 years.  Hypothyroidism Managed  with thyroid  medication under [facility] and now by Dr. Oral  General Health Maintenance Maintains healthy lifestyle and up-to-date screenings. - Order blood work. - Administer pneumococcal vaccine. - Advise flu shot in a month. - Confirm up-to-date status of shingles vaccine- is [facility] Health Maintenance  Topic Date Due   HIV Screening  Never done   Hepatitis C Screening  Never done   COVID-19 Vaccine (5 - 2025-26 season) 01/05/2024*   Flu Shot  06/20/2024*   Hepatitis B Vaccine (2 of  3 - 19+ 3-dose series) 12/19/2024*   Breast Cancer Screening  04/02/2024   Pap with HPV screening  10/23/2024   Colon Cancer Screening  04/11/2030   DTaP/Tdap/Td vaccine (3 - Td or Tdap) 09/04/2030   Pneumococcal Vaccine for age over 34  Completed   Zoster (Shingles) Vaccine  Completed   HPV Vaccine  Aged Out   Meningitis B Vaccine  Aged Out  *Topic was postponed. The date shown is not the original due date.    Most recent fall risk assessment:    12/20/2023    8:17 AM  Fall Risk   Falls in the past year? 1  Number falls in past yr: 0     Most recent depression screenings:    12/20/2023    8:17 AM  PHQ 2/9 Scores  PHQ - 2 Score 0      The 10-year ASCVD risk score (Arnett DK, et al., 2019) is: 1.5%   Values used to calculate the score:     Age: 53 years     Clincally relevant sex: Female     Is Non-Hispanic African American: No     Diabetic: No     Tobacco smoker: No     Systolic Blood Pressure: 122 mmHg     Is BP treated: No     HDL Cholesterol: 66 mg/dL     Total Cholesterol: 211 mg/dL  Past Surgical History:  Procedure Laterality Date   DENTAL SURGERY     MOUTH SURGERY     WISDOM TOOTH EXTRACTION     Social History   Tobacco Use   Smoking status: Never   Smokeless tobacco: Never  Vaping Use   Vaping status: Never Used  Substance Use Topics   Alcohol use: Yes    Comment: occ   Drug use: No   Social History   Socioeconomic History   Marital status: Married    Spouse name: Not on file   Number of children: Not on file   Years of education: Not on file   Highest education level: Not on file  Occupational History   Not on file  Tobacco Use   Smoking status: Never   Smokeless tobacco: Never  Vaping Use   Vaping status: Never Used  Substance and Sexual Activity   Alcohol use: Yes    Comment: occ   Drug use: No   Sexual activity: Yes    Partners: Male    Birth control/protection: Pill    Comment: 1st intercourse- 19, partner-3  Other Topics  Concern   Not on file  Social History Narrative   Not on file   Social Drivers of Health   Financial Resource Strain: Not on file  Food Insecurity: Low Risk  (12/03/2022)   Received from [facility]   Hunger Vital Sign    Within the past 12 months, you worried that your food would run out before you got money to buy more: Never true  Within the past 12 months, the food you bought just didn't last and you didn't have money to get more. : Never true  Transportation Needs: No Transportation Needs (12/03/2022)   Received from [facility]    In the past 12 months, has lack of reliable transportation kept you from medical appointments, meetings, work or from getting things needed for daily living? : No  Physical Activity: Not on file  Stress: Not on file  Social Connections: Not on file  Intimate Partner Violence: Not on file   Family History  Problem Relation Age of Onset   Breast cancer Mother    Diabetes Mother    Heart disease Mother    Heart failure Mother    Valvular heart disease Mother    Asthma Mother    Colon polyps Father    Prostate cancer Father    Alzheimer's disease Father    Heart attack Father    Breast cancer Maternal Grandmother    Rheum arthritis Maternal Grandmother    Leukemia Maternal Grandfather    Stomach cancer Neg Hx    Esophageal cancer Neg Hx    Pancreatic cancer Neg Hx    No Known Allergies   Patient Care Team: Aletha Bene, MD as PCP - General (Family Medicine) Raford Riggs, MD as PCP - Cardiology (Cardiology)   Outpatient Medications Prior to Visit  Medication Sig   Ascorbic Acid (VITAMIN C PO) Take 1,000 mg by mouth daily.   Cholecalciferol (VITAMIN D-3 PO) Take 1 tablet by mouth daily.   cyanocobalamin  100 MCG tablet Take 100 mcg by mouth daily.   estradiol  (ESTRACE ) 0.1 MG/GM vaginal cream Place 1 Applicatorful vaginally 3 (three) times a week.   estradiol  (VIVELLE -DOT) 0.05 MG/24HR patch Place 1 patch onto  the skin 2 (two) times a week.   lansoprazole  (PREVACID ) 30 MG capsule TAKE 1 CAPSULE(30 MG) BY MOUTH EVERY MORNING   MAGNESIUM PO Take 144 mg by mouth daily.   NONFORMULARY OR COMPOUNDED ITEM Testosterone  4% cream   NOREL AD 4-10-325 MG TABS Take 1 tablet by mouth every 4 (four) hours as needed.   NP THYROID  30 MG tablet Take 30 mg by mouth daily.   Prasterone, DHEA, (DHEA PO) Take 15 mg by mouth daily.   progesterone (PROMETRIUM) 100 MG capsule Take 100 mg by mouth at bedtime.   spironolactone (ALDACTONE) 50 MG tablet Take 50 mg by mouth daily.   No facility-administered medications prior to visit.    ROS     Objective:    BP 122/84   Pulse 68   Temp 98.1 F (36.7 C)   Ht 5' 1 (1.549 m)   Wt 137 lb (62.1 kg)   SpO2 99%   BMI 25.89 kg/m  BP Readings from Last 3 Encounters:  12/20/23 122/84  06/21/23 90/70  04/27/23 96/78   Wt Readings from Last 3 Encounters:  12/20/23 137 lb (62.1 kg)  06/21/23 134 lb 8 oz (61 kg)  04/27/23 137 lb (62.1 kg)    Physical Exam Vitals and nursing note reviewed.  Constitutional:      General: She is not in acute distress.    Appearance: Normal appearance.  HENT:     Head: Normocephalic.     Right Ear: Tympanic membrane, ear canal and external ear normal.     Left Ear: Tympanic membrane, ear canal and external ear normal.     Mouth/Throat:     Pharynx: No oropharyngeal exudate or posterior oropharyngeal erythema.  Eyes:     Extraocular Movements: Extraocular movements intact.     Conjunctiva/sclera: Conjunctivae normal.     Pupils: Pupils are equal, round, and reactive to light.  Cardiovascular:     Rate and Rhythm: Normal rate and regular rhythm.     Heart sounds: Normal heart sounds. No murmur heard. Pulmonary:     Effort: Pulmonary effort is normal.     Breath sounds: Normal breath sounds. No wheezing or rhonchi.  Chest:  Breasts:    Right: No swelling, inverted nipple, mass, nipple discharge or tenderness.     Left: No  bleeding, inverted nipple, mass, nipple discharge or tenderness.  Abdominal:     Tenderness: There is no abdominal tenderness. There is no guarding or rebound.     Hernia: No hernia is present.  Musculoskeletal:     Thoracic back: Normal.     Lumbar back: No tenderness or bony tenderness. Normal range of motion. Negative right straight leg raise test and negative left straight leg raise test.     Right lower leg: No edema.     Left lower leg: No edema.  Skin:    Findings: Lesion present.     Comments: Numerous pigmented lesions, has freckles over face, no atypia appreciated.   Neurological:     General: No focal deficit present.     Mental Status: She is alert and oriented to person, place, and time.     Cranial Nerves: Cranial nerves 2-12 are intact.     Sensory: Sensation is intact.     Motor: No weakness.     Coordination: Coordination is intact. Romberg sign negative. Coordination normal. Finger-Nose-Finger Test normal.     Gait: Gait and tandem walk normal.     Deep Tendon Reflexes: Reflexes are normal and symmetric. Reflexes normal.  Psychiatric:        Mood and Affect: Mood normal.        Behavior: Behavior normal.        Thought Content: Thought content normal.        Judgment: Judgment normal.      No results found for any visits on 12/20/23.      Connie Emperor, MD

## 2023-12-21 ENCOUNTER — Ambulatory Visit: Payer: Self-pay | Admitting: Family Medicine

## 2023-12-22 LAB — ANTI-NUCLEAR AB-TITER (ANA TITER): ANA Titer 1: 1:80 {titer} — ABNORMAL HIGH

## 2023-12-22 LAB — CBC
HCT: 38.2 % (ref 35.0–45.0)
Hemoglobin: 12.2 g/dL (ref 11.7–15.5)
MCH: 30.3 pg (ref 27.0–33.0)
MCHC: 31.9 g/dL — ABNORMAL LOW (ref 32.0–36.0)
MCV: 95 fL (ref 80.0–100.0)
MPV: 10.2 fL (ref 7.5–12.5)
Platelets: 336 Thousand/uL (ref 140–400)
RBC: 4.02 Million/uL (ref 3.80–5.10)
RDW: 13.3 % (ref 11.0–15.0)
WBC: 7.2 Thousand/uL (ref 3.8–10.8)

## 2023-12-22 LAB — COMPREHENSIVE METABOLIC PANEL WITH GFR
AG Ratio: 1.7 (calc) (ref 1.0–2.5)
ALT: 14 U/L (ref 6–29)
AST: 17 U/L (ref 10–35)
Albumin: 4.3 g/dL (ref 3.6–5.1)
Alkaline phosphatase (APISO): 54 U/L (ref 37–153)
BUN: 16 mg/dL (ref 7–25)
CO2: 25 mmol/L (ref 20–32)
Calcium: 9 mg/dL (ref 8.6–10.4)
Chloride: 106 mmol/L (ref 98–110)
Creat: 0.68 mg/dL (ref 0.50–1.03)
Globulin: 2.6 g/dL (ref 1.9–3.7)
Glucose, Bld: 96 mg/dL (ref 65–99)
Potassium: 4.1 mmol/L (ref 3.5–5.3)
Sodium: 139 mmol/L (ref 135–146)
Total Bilirubin: 0.7 mg/dL (ref 0.2–1.2)
Total Protein: 6.9 g/dL (ref 6.1–8.1)
eGFR: 103 mL/min/1.73m2 (ref >=60)

## 2023-12-22 LAB — TEST AUTHORIZATION

## 2023-12-22 LAB — LIPID PANEL
Cholesterol: 193 mg/dL (ref <200)
HDL: 69 mg/dL (ref >=50)
LDL Cholesterol (Calc): 106 mg/dL — ABNORMAL HIGH
Non-HDL Cholesterol (Calc): 124 mg/dL (ref <130)
Total CHOL/HDL Ratio: 2.8 (calc) (ref <5.0)
Triglycerides: 86 mg/dL (ref <150)

## 2023-12-22 LAB — TSH: TSH: 2.72 m[IU]/L

## 2023-12-22 LAB — IRON,TIBC AND FERRITIN PANEL
%SAT: 29 % (ref 16–45)
Ferritin: 50 ng/mL (ref 16–232)
Iron: 95 ug/dL (ref 45–160)
TIBC: 325 ug/dL (ref 250–450)

## 2023-12-22 LAB — C-REACTIVE PROTEIN: CRP: 10.6 mg/L — ABNORMAL HIGH (ref <8.0)

## 2023-12-22 LAB — RHEUMATOID FACTOR: Rheumatoid fact SerPl-aCnc: <10 [IU]/mL (ref <14)

## 2023-12-22 LAB — ANA: Anti Nuclear Antibody (ANA): POSITIVE — AB

## 2023-12-22 LAB — CYCLIC CITRUL PEPTIDE ANTIBODY, IGG: Cyclic Citrullin Peptide Ab: <16 U

## 2023-12-22 LAB — SEDIMENTATION RATE: Sed Rate: 19 mm/h (ref 0–30)

## 2023-12-27 ENCOUNTER — Other Ambulatory Visit: Payer: Self-pay | Admitting: Gastroenterology

## 2023-12-27 ENCOUNTER — Other Ambulatory Visit: Payer: Self-pay

## 2023-12-27 DIAGNOSIS — K219 Gastro-esophageal reflux disease without esophagitis: Secondary | ICD-10-CM

## 2023-12-27 DIAGNOSIS — R7689 Other specified abnormal immunological findings in serum: Secondary | ICD-10-CM

## 2023-12-27 MED ORDER — LANSOPRAZOLE 15 MG PO CPDR: 15.0000 mg | DELAYED_RELEASE_CAPSULE | Freq: Every day | ORAL | 1 refills | Status: DC

## 2023-12-27 NOTE — Progress Notes (Signed)
 Reduced dose form 30mg  to 15mg  per patient request

## 2024-01-13 DIAGNOSIS — N950 Postmenopausal bleeding: Secondary | ICD-10-CM | POA: Diagnosis not present

## 2024-01-25 DIAGNOSIS — L814 Other melanin hyperpigmentation: Secondary | ICD-10-CM | POA: Diagnosis not present

## 2024-01-25 DIAGNOSIS — L821 Other seborrheic keratosis: Secondary | ICD-10-CM | POA: Diagnosis not present

## 2024-01-25 DIAGNOSIS — D1801 Hemangioma of skin and subcutaneous tissue: Secondary | ICD-10-CM | POA: Diagnosis not present

## 2024-01-25 NOTE — Addendum Note (Signed)
 Addended by: Amias Hutchinson N on: 01/25/2024 01:46 PM   Modules accepted: Orders

## 2024-03-01 DIAGNOSIS — Z01419 Encounter for gynecological examination (general) (routine) without abnormal findings: Secondary | ICD-10-CM | POA: Diagnosis not present

## 2024-03-01 DIAGNOSIS — Z6825 Body mass index (BMI) 25.0-25.9, adult: Secondary | ICD-10-CM | POA: Diagnosis not present

## 2024-03-29 NOTE — Progress Notes (Unsigned)
 "  Office Visit Note  Patient: Gina Bauer             Date of Birth: Nov 02, 1970           MRN: [id number]             PCP: Aletha Bene, MD Referring: Aletha Bene, MD Visit Date: 03/30/2024 Occupation: Data Unavailable  Subjective:  No chief complaint on file.   History of Present Illness: Gina Bauer is a 54 y.o. female ***     Activities of Daily Living:  Patient reports morning stiffness for 0 minutes.   Patient Denies nocturnal pain.  Difficulty dressing/grooming: Denies Difficulty climbing stairs: Denies Difficulty getting out of chair: Denies Difficulty using hands for taps, buttons, cutlery, and/or writing: Denies  Review of Systems  Constitutional:  Positive for fatigue.  HENT:  Negative for mouth sores and mouth dryness.   Eyes:  Positive for dryness.  Respiratory:  Negative for shortness of breath.   Cardiovascular:  Negative for chest pain and palpitations.  Gastrointestinal:  Positive for constipation. Negative for blood in stool and diarrhea.  Endocrine: Negative for increased urination.  Genitourinary:  Negative for involuntary urination.  Musculoskeletal:  Negative for joint pain, gait problem, joint pain, joint swelling, myalgias, muscle weakness, morning stiffness, muscle tenderness and myalgias.  Skin:  Positive for hair loss. Negative for color change, rash and sensitivity to sunlight.  Allergic/Immunologic: Negative for susceptible to infections.  Neurological:  Positive for headaches. Negative for dizziness.  Hematological:  Negative for swollen glands.  Psychiatric/Behavioral:  Negative for depressed mood and sleep disturbance. The patient is nervous/anxious.     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   Urge incontinence 02/08/2014   PELVIC  PAIN 07/07/2007    Past Medical History:  Diagnosis Date   Anemia    Anxiety    Bladder disorder    Colitis    CRP elevated 01/26/2022   Hypothyroid     Family History  Problem Relation Age of Onset   Breast cancer Mother    Diabetes Mother    Heart disease Mother    Heart failure Mother    Valvular heart disease Mother    Asthma Mother    Colon polyps Father    Prostate cancer Father    Alzheimer's disease Father    Heart attack Father    Breast cancer Maternal Grandmother    Rheum arthritis Maternal Grandmother    Leukemia Maternal Grandfather    Stomach cancer Neg Hx    Esophageal cancer Neg Hx    Pancreatic cancer Neg Hx    Past Surgical History:  Procedure Laterality Date   DENTAL SURGERY     MOUTH SURGERY     WISDOM TOOTH EXTRACTION     Social History[1] Social History   Social History Narrative   Not on file     Immunization History  Administered Date(s) Administered   Hep A, Unspecified 09/06/2013   Hepatitis A, Adult 09/06/2013   Hepatitis B,  ADULT 09/06/2013   Influenza Split 02/08/2014, 05/02/2015, 03/04/2016   Influenza, Seasonal, Injecte, Preservative Fre 12/24/2010, 01/29/2012   Influenza,inj,Quad PF,6+ Mos 05/02/2015, 03/04/2016, 03/09/2017, 03/04/2018, 01/06/2019, 01/04/2021, 12/05/2021   Influenza,inj,Quad PF,6-35 Mos 03/09/2017, 03/04/2018, 01/06/2019   Influenza,inj,quad, With Preservative 02/08/2014   Influenza-Unspecified 02/08/2014, 05/02/2015, 03/04/2016, 03/09/2017, 03/04/2018, 01/06/2019   Moderna Sars-Covid-2 Vaccination 06/06/2019, 07/05/2019, 02/27/2020   Novel Infuenza-h1n1-09 01/28/2008   PFIZER  Comirnaty(Gray Top)Covid-19 Tri-Sucrose Vaccine 01/02/2020   PNEUMOCOCCAL CONJUGATE-20 12/20/2023   Tdap 12/24/2010, 09/03/2020   Zoster Recombinant(Shingrix) 01/23/2021, 05/17/2021     Objective: Vital Signs: There were no vitals taken for this visit.   Physical Exam   Musculoskeletal Exam: ***  CDAI Exam: CDAI Score: -- Patient Global: --; Provider Global: -- Swollen: --; Tender: -- Joint Exam 03/30/2024   No joint exam has been documented for this visit   There is currently no information documented on the homunculus. Go to the Rheumatology activity and complete the homunculus joint exam.  Investigation: No additional findings.  Imaging: No results found.  Recent Labs: Lab Results  Component Value Date   WBC 7.2 12/20/2023   HGB 12.2 12/20/2023   PLT 336 12/20/2023   NA 139 12/20/2023   K 4.1 12/20/2023   CL 106 12/20/2023   CO2 25 12/20/2023   GLUCOSE 96 12/20/2023   BUN 16 12/20/2023   CREATININE 0.68 12/20/2023   BILITOT 0.7 12/20/2023   ALKPHOS 65 04/12/2023   AST 17 12/20/2023   ALT 14 12/20/2023   PROT 6.9 12/20/2023   ALBUMIN 3.6 04/12/2023   CALCIUM 9.0 12/20/2023   GFRAA >60 12/25/2019    Speciality Comments: No specialty comments available.  Procedures:  No procedures performed Allergies: Patient has no known allergies.   Assessment / Plan:     Visit Diagnoses: No diagnosis found.  Orders: No orders of the defined types were placed in this encounter.  No orders of the defined types were placed in this encounter.   Face-to-face time spent with patient was *** minutes. Greater than 50% of time was spent in counseling and coordination of care.  Follow-Up Instructions: No follow-ups on file.   Alfonso Patterson, LPN  Note - This record has been created using [employer].  Chart creation errors have been sought, but may not always  have been located. Such creation errors do not reflect on  the standard of medical care.     [1]   Social History Tobacco Use   Smoking status: Never   Smokeless tobacco: Never  Vaping Use   Vaping status: Never Used  Substance Use Topics   Alcohol use: Yes    Comment: occ   Drug use: No   "

## 2024-03-30 ENCOUNTER — Ambulatory Visit

## 2024-03-30 ENCOUNTER — Encounter: Payer: Self-pay | Admitting: Family Medicine

## 2024-03-30 VITALS — BP 109/71 | HR 71 | Temp 97.9°F | Resp 13 | Ht 61.75 in | Wt 137.2 lb

## 2024-03-30 DIAGNOSIS — R7982 Elevated C-reactive protein (CRP): Secondary | ICD-10-CM

## 2024-03-30 DIAGNOSIS — R7689 Other specified abnormal immunological findings in serum: Secondary | ICD-10-CM

## 2024-03-31 LAB — CELIAC DISEASE PANEL
(tTG) Ab, IgA: <1 U/mL
(tTG) Ab, IgG: <1 U/mL
Deamidated Gliadin Abs, IgG: <1 U/mL
Gliadin IgA: 1.8 U/mL
Immunoglobulin A: 186 mg/dL (ref 47–310)

## 2024-03-31 LAB — SJOGREN'S SYNDROME ANTIBODS(SSA + SSB)
SSA (Ro) (ENA) Antibody, IgG: <1 AI
SSB (La) (ENA) Antibody, IgG: <1 AI

## 2024-04-18 ENCOUNTER — Ambulatory Visit: Payer: Self-pay

## 2024-09-28 ENCOUNTER — Ambulatory Visit

## 2024-12-22 ENCOUNTER — Encounter: Admitting: Family Medicine
